# Patient Record
Sex: Female | Born: 1960 | Race: Black or African American | Hispanic: No | Marital: Single | Smoking: Never smoker
Health system: Southern US, Community
[De-identification: ages and names within clinical notes are randomized; demographics above are authoritative.]

## PROBLEM LIST (undated history)

## (undated) ENCOUNTER — Emergency Department (HOSPITAL_BASED_OUTPATIENT_CLINIC_OR_DEPARTMENT_OTHER): Payer: Self-pay | Source: Home / Self Care

## (undated) DIAGNOSIS — T7840XA Allergy, unspecified, initial encounter: Secondary | ICD-10-CM

## (undated) DIAGNOSIS — E119 Type 2 diabetes mellitus without complications: Secondary | ICD-10-CM

## (undated) DIAGNOSIS — M1990 Unspecified osteoarthritis, unspecified site: Secondary | ICD-10-CM

## (undated) DIAGNOSIS — D649 Anemia, unspecified: Secondary | ICD-10-CM

## (undated) HISTORY — DX: Anemia, unspecified: D64.9

## (undated) HISTORY — DX: Unspecified osteoarthritis, unspecified site: M19.90

## (undated) HISTORY — PX: KNEE SURGERY: SHX244

## (undated) HISTORY — DX: Allergy, unspecified, initial encounter: T78.40XA

---

## 2001-09-18 ENCOUNTER — Encounter: Admission: RE | Admit: 2001-09-18 | Discharge: 2001-09-18 | Payer: Self-pay | Admitting: Emergency Medicine

## 2001-09-18 ENCOUNTER — Encounter: Payer: Self-pay | Admitting: Emergency Medicine

## 2002-09-02 ENCOUNTER — Emergency Department (HOSPITAL_COMMUNITY): Admission: EM | Admit: 2002-09-02 | Discharge: 2002-09-02 | Payer: Self-pay | Admitting: Emergency Medicine

## 2006-12-18 ENCOUNTER — Encounter: Admission: RE | Admit: 2006-12-18 | Discharge: 2006-12-18 | Payer: Self-pay | Admitting: Family Medicine

## 2010-07-15 ENCOUNTER — Encounter: Payer: Self-pay | Admitting: Family Medicine

## 2014-05-03 ENCOUNTER — Encounter: Payer: Self-pay | Admitting: Family Medicine

## 2014-05-03 ENCOUNTER — Ambulatory Visit (INDEPENDENT_AMBULATORY_CARE_PROVIDER_SITE_OTHER): Payer: 59 | Admitting: Family Medicine

## 2014-05-03 ENCOUNTER — Telehealth: Payer: Self-pay | Admitting: Family Medicine

## 2014-05-03 VITALS — BP 140/92 | HR 99 | Temp 98.5°F | Resp 18 | Ht 67.5 in | Wt 263.6 lb

## 2014-05-03 DIAGNOSIS — J040 Acute laryngitis: Secondary | ICD-10-CM

## 2014-05-03 DIAGNOSIS — J029 Acute pharyngitis, unspecified: Secondary | ICD-10-CM

## 2014-05-03 DIAGNOSIS — E049 Nontoxic goiter, unspecified: Secondary | ICD-10-CM

## 2014-05-03 LAB — POCT CBC
Granulocyte percent: 66.8 %G (ref 37–80)
HCT, POC: 41.1 % (ref 37.7–47.9)
Hemoglobin: 12.3 g/dL (ref 12.2–16.2)
Lymph, poc: 2.8 (ref 0.6–3.4)
MCH, POC: 21.2 pg — AB (ref 27–31.2)
MCHC: 29.9 g/dL — AB (ref 31.8–35.4)
MCV: 70.7 fL — AB (ref 80–97)
MID (cbc): 0.6 (ref 0–0.9)
MPV: 8.2 fL (ref 0–99.8)
POC Granulocyte: 6.8 (ref 2–6.9)
POC LYMPH PERCENT: 27.6 %L (ref 10–50)
POC MID %: 5.6 %M (ref 0–12)
Platelet Count, POC: 329 10*3/uL (ref 142–424)
RBC: 5.82 M/uL — AB (ref 4.04–5.48)
RDW, POC: 14.2 %
WBC: 10.2 10*3/uL (ref 4.6–10.2)

## 2014-05-03 LAB — THYROID PANEL WITH TSH
Free Thyroxine Index: 2 (ref 1.4–3.8)
T3 Uptake: 28 % (ref 22–35)
T4, Total: 7 ug/dL (ref 4.5–12.0)
TSH: 1.305 u[IU]/mL (ref 0.350–4.500)

## 2014-05-03 LAB — POCT RAPID STREP A (OFFICE): Rapid Strep A Screen: NEGATIVE

## 2014-05-03 MED ORDER — FIRST-DUKES MOUTHWASH MT SUSP: 5.0000 mL | OROMUCOSAL | Status: DC | PRN

## 2014-05-03 MED ORDER — AMOXICILLIN 500 MG PO CAPS: 500.0000 mg | ORAL_CAPSULE | Freq: Two times a day (BID) | ORAL | Status: DC

## 2014-05-03 MED ORDER — HYDROCODONE-ACETAMINOPHEN 7.5-325 MG/15ML PO SOLN: 5.0000 mL | ORAL | Status: DC | PRN

## 2014-05-03 NOTE — Patient Instructions (Signed)
Laryngitis At the top of your windpipe is your voice box. It is the source of your voice. Inside your voice box are 2 bands of muscles called vocal cords. When you breathe, your vocal cords are relaxed and open so that air can get into the lungs. When you decide to say something, these cords come together and vibrate. The sound from these vibrations goes into your throat and comes out through your mouth as sound. Laryngitis is an inflammation of the vocal cords that causes hoarseness, cough, loss of voice, sore throat, and dry throat. Laryngitis can be temporary (acute) or long-term (chronic). Most cases of acute laryngitis improve with time.Chronic laryngitis lasts for more than 3 weeks. CAUSES Laryngitis can often be related to excessive smoking, talking, or yelling, as well as inhalation of toxic fumes and allergies. Acute laryngitis is usually caused by a viral infection, vocal strain, measles or mumps, or bacterial infections. Chronic laryngitis is usually caused by vocal cord strain, vocal cord injury, postnasal drip, growths on the vocal cords, or acid reflux. SYMPTOMS   Cough.  Sore throat.  Dry throat. RISK FACTORS  Respiratory infections.  Exposure to irritating substances, such as cigarette smoke, excessive amounts of alcohol, stomach acids, and workplace chemicals.  Voice trauma, such as vocal cord injury from shouting or speaking too loud. DIAGNOSIS  Your cargiver will perform a physical exam. During the physical exam, your caregiver will examine your throat. The most common sign of laryngitis is hoarseness. Laryngoscopy may be necessary to confirm the diagnosis of this condition. This procedure allows your caregiver to look into the larynx. HOME CARE INSTRUCTIONS  Drink enough fluids to keep your urine clear or pale yellow.  Rest until you no longer have symptoms or as directed by your caregiver.  Breathe in moist air.  Take all medicine as directed by your  caregiver.  Do not smoke.  Talk as little as possible (this includes whispering).  Write on paper instead of talking until your voice is back to normal.  Follow up with your caregiver if your condition has not improved after 10 days. SEEK MEDICAL CARE IF:   You have trouble breathing.  You cough up blood.  You have persistent fever.  You have increasing pain.  You have difficulty swallowing. MAKE SURE YOU:  Understand these instructions.  Will watch your condition.  Will get help right away if you are not doing well or get worse. Document Released: 06/10/2005 Document Revised: 09/02/2011 Document Reviewed: 08/16/2010 [facility] Patient Information 2015 [place], [place]. This information is not intended to replace advice given to you by your health care provider. Make sure you discuss any questions you have with your health care provider.  Pharyngitis Pharyngitis is redness, pain, and swelling (inflammation) of your pharynx.  CAUSES  Pharyngitis is usually caused by infection. Most of the time, these infections are from viruses (viral) and are part of a cold. However, sometimes pharyngitis is caused by bacteria (bacterial). Pharyngitis can also be caused by allergies. Viral pharyngitis may be spread from person to person by coughing, sneezing, and personal items or utensils (cups, forks, spoons, toothbrushes). Bacterial pharyngitis may be spread from person to person by more intimate contact, such as kissing.  SIGNS AND SYMPTOMS  Symptoms of pharyngitis include:   Sore throat.   Tiredness (fatigue).   Low-grade fever.   Headache.  Joint pain and muscle aches.  Skin rashes.  Swollen lymph nodes.  Plaque-like film on throat or tonsils (often seen with bacterial pharyngitis).  DIAGNOSIS  Your health care provider will ask you questions about your illness and your symptoms. Your medical history, along with a physical exam, is often all that is needed to diagnose  pharyngitis. Sometimes, a rapid strep test is done. Other lab tests may also be done, depending on the suspected cause.  TREATMENT  Viral pharyngitis will usually get better in 3-4 days without the use of medicine. Bacterial pharyngitis is treated with medicines that kill germs (antibiotics).  HOME CARE INSTRUCTIONS   Drink enough water and fluids to keep your urine clear or pale yellow.   Only take over-the-counter or prescription medicines as directed by your health care provider:   If you are prescribed antibiotics, make sure you finish them even if you start to feel better.   Do not take aspirin.   Get lots of rest.   Gargle with 8 oz of salt water ( tsp of salt per 1 qt of water) as often as every 1-2 hours to soothe your throat.   Throat lozenges (if you are not at risk for choking) or sprays may be used to soothe your throat. SEEK MEDICAL CARE IF:   You have large, tender lumps in your neck.  You have a rash.  You cough up green, yellow-brown, or bloody spit. SEEK IMMEDIATE MEDICAL CARE IF:   Your neck becomes stiff.  You drool or are unable to swallow liquids.  You vomit or are unable to keep medicines or liquids down.  You have severe pain that does not go away with the use of recommended medicines.  You have trouble breathing (not caused by a stuffy nose). MAKE SURE YOU:   Understand these instructions.  Will watch your condition.  Will get help right away if you are not doing well or get worse. Document Released: 06/10/2005 Document Revised: 03/31/2013 Document Reviewed: 02/15/2013 [facility] Patient Information 2015 [place], [place]. This information is not intended to replace advice given to you by your health care provider. Make sure you discuss any questions you have with your health care provider.

## 2014-05-03 NOTE — Telephone Encounter (Signed)
Forms completed and returned to fmla pile in front office. es

## 2014-05-03 NOTE — Progress Notes (Signed)
Subjective:    Patient ID: Diana Burns, female    DOB: 1960-12-06, 53 y.o.   MRN: [id number] This chart was scribed for Norberto SorensonEva Sakinah Rosamond, MD by Jolene Provostobert Halas, Medical Scribe. This patient was seen in Room 9 and the patient's care was started a 11:08 AM.  Chief Complaint  Patient presents with  . Sore Throat    x3 days    HPI Past Medical History  Diagnosis Date  . Allergy   . Anemia   . Arthritis    Allergies  Allergen Reactions  . Bee Venom   . Latex    No current outpatient prescriptions on file prior to visit.   No current facility-administered medications on file prior to visit.   HPI Comments: Diana Burns is a 53 y.o. [facility] of sore throat that started five days ago that got gradually better, and then worsened after she worked in the rain all day. Pt endorses associated throat pain and voice disturbance. Pt has used Alka-Seltzer, Halls cough drops and OTC cough syrup with some relief. Pt denies trouble breathing, SOB, or inability to swallow.    Review of Systems  Constitutional: Positive for chills, activity change, appetite change and fatigue. Negative for fever and diaphoresis.  HENT: Positive for congestion, sore throat, trouble swallowing and voice change. Negative for ear discharge, ear pain, mouth sores, nosebleeds, postnasal drip, rhinorrhea, sinus pressure and sneezing.   Eyes: Negative for pain and itching.  Respiratory: Negative for cough and shortness of breath.   Cardiovascular: Negative for chest pain.  Gastrointestinal: Negative for nausea, vomiting, abdominal pain, diarrhea and constipation.  Genitourinary: Negative for dysuria.  Musculoskeletal: Positive for myalgias and neck pain. Negative for joint swelling, arthralgias, gait problem and neck stiffness.  Neurological: Negative for dizziness, syncope and headaches.  Hematological: Positive for adenopathy.  Psychiatric/Behavioral: Positive for sleep disturbance.         Objective:  BP 140/92 mmHg  Pulse 99  Temp(Src) 98.5 F (36.9 C) (Oral)  Resp 18  Ht 5' 7.5" (1.715 m)  Wt 263 lb 9.6 oz (119.568 kg)  BMI 40.65 kg/m2  SpO2 97%  LMP 05/01/2014  Physical Exam  Constitutional: She is oriented to person, place, and time. She appears well-developed and well-nourished.  HENT:  Head: Normocephalic and atraumatic.  Right Ear: Tympanic membrane is retracted.  Left Ear: Tympanic membrane is retracted.  Nose: Nose normal.  Tonsils swollen to 3+ with right worse than left.  Eyes: Pupils are equal, round, and reactive to light.  Neck: Neck supple. Thyromegaly present.  Severe submandibular and supraclavicular adenopathy, enlarged thyroid.   Cardiovascular: Normal rate and regular rhythm.   Pulmonary/Chest: Effort normal and breath sounds normal. No respiratory distress.  Lymphadenopathy:    She has cervical adenopathy.  Neurological: She is alert and oriented to person, place, and time.  Skin: Skin is warm and dry.  Psychiatric: She has a normal mood and affect. Her behavior is normal.  Nursing note and vitals reviewed.      Assessment & Plan:   Acute pharyngitis, unspecified pharyngitis type - Plan: POCT rapid strep A, Culture, Group A Strep  Laryngitis, acute - pt recommended oow x 1-2d - reports her job mandates 3d oow at [employer] - fmla paperwork completed  Enlarged thyroid - Plan: POCT CBC, Thyroid Panel With TSH - recommend recheck in OV in 1-2 mos w/ US at that time if still feels enlarged - pt requests time for f/u OV be put  into her fmla papers.  Meds ordered this encounter  Medications  . HYDROcodone-acetaminophen (HYCET) 7.5-325 mg/15 ml solution    Sig: Take 5-10 mLs by mouth every 4 (four) hours as needed for moderate pain.    Dispense:  140 mL    Refill:  0  . Diphenhyd-Hydrocort-Nystatin (FIRST-DUKES MOUTHWASH) SUSP    Sig: Use as directed 5 mLs in the mouth or throat every 2 (two) hours as needed (sore throat).    Dispense:  237  mL    Refill:  0    Mix 2:1 ratio with viscous lidocaine  . amoxicillin (AMOXIL) 500 MG capsule    Sig: Take 1 capsule (500 mg total) by mouth 2 (two) times daily.    Dispense:  20 capsule    Refill:  0    I personally performed the services described in this documentation, which was scribed in my presence. The recorded information has been reviewed and considered, and addended by me as needed.  Norberto SorensonEva Kati Riggenbach, MD MPH   Results for orders placed or performed in visit on 05/03/14  Thyroid Panel With TSH  Result Value Ref Range   T4, Total 7.0 4.5 - 12.0 ug/dL   T3 Uptake 28 22 - 35 %   Free Thyroxine Index 2.0 1.4 - 3.8   TSH 1.305 0.350 - 4.500 uIU/mL  POCT rapid strep A  Result Value Ref Range   Rapid Strep A Screen Negative Negative  POCT CBC  Result Value Ref Range   WBC 10.2 4.6 - 10.2 K/uL   Lymph, poc 2.8 0.6 - 3.4   POC LYMPH PERCENT 27.6 10 - 50 %L   MID (cbc) 0.6 0 - 0.9   POC MID % 5.6 0 - 12 %M   POC Granulocyte 6.8 2 - 6.9   Granulocyte percent 66.8 37 - 80 %G   RBC 5.82 (A) 4.04 - 5.48 M/uL   Hemoglobin 12.3 12.2 - 16.2 g/dL   HCT, POC 16.141.1 09.637.7 - 47.9 %   MCV 70.7 (A) 80 - 97 fL   MCH, POC 21.2 (A) 27 - 31.2 pg   MCHC 29.9 (A) 31.8 - 35.4 g/dL   RDW, POC 04.514.2 %   Platelet Count, POC 329 142 - 424 K/uL   MPV 8.2 0 - 99.8 fL

## 2014-05-03 NOTE — Telephone Encounter (Signed)
Patient dropped of FMLA ppw after her OV on 05/03/2014. Patient was seen for an illness. Please return in 5-7 business days.

## 2014-05-04 ENCOUNTER — Encounter: Payer: Self-pay | Admitting: Family Medicine

## 2014-05-04 DIAGNOSIS — Z0271 Encounter for disability determination: Secondary | ICD-10-CM

## 2014-05-04 MED ORDER — PREDNISONE 20 MG PO TABS: 60.0000 mg | ORAL_TABLET | Freq: Every day | ORAL | Status: DC

## 2014-05-05 LAB — CULTURE, GROUP A STREP: Organism ID, Bacteria: NORMAL

## 2014-05-05 NOTE — Telephone Encounter (Signed)
Patient picked up forms.

## 2014-05-13 DIAGNOSIS — Z0271 Encounter for disability determination: Secondary | ICD-10-CM

## 2015-01-23 ENCOUNTER — Ambulatory Visit (INDEPENDENT_AMBULATORY_CARE_PROVIDER_SITE_OTHER): Payer: 59 | Admitting: Family Medicine

## 2015-01-23 VITALS — BP 130/84 | HR 77 | Temp 98.9°F | Resp 18 | Ht 67.75 in | Wt 268.0 lb

## 2015-01-23 DIAGNOSIS — R110 Nausea: Secondary | ICD-10-CM

## 2015-01-23 DIAGNOSIS — Z566 Other physical and mental strain related to work: Secondary | ICD-10-CM | POA: Diagnosis not present

## 2015-01-23 DIAGNOSIS — G43909 Migraine, unspecified, not intractable, without status migrainosus: Secondary | ICD-10-CM

## 2015-01-23 DIAGNOSIS — G4700 Insomnia, unspecified: Secondary | ICD-10-CM

## 2015-01-23 MED ORDER — ONDANSETRON 4 MG PO TBDP: 4.0000 mg | ORAL_TABLET | Freq: Once | ORAL | Status: DC

## 2015-01-23 MED ORDER — SUMATRIPTAN SUCCINATE 25 MG PO TABS
25.0000 mg | ORAL_TABLET | ORAL | Status: DC | PRN
Start: 2015-01-23 — End: 2016-01-16

## 2015-01-23 MED ORDER — KETOROLAC TROMETHAMINE 60 MG/2ML IM SOLN: 60.0000 mg | Freq: Once | INTRAMUSCULAR | Status: DC

## 2015-01-23 MED ORDER — ONDANSETRON 4 MG PO TBDP: 4.0000 mg | ORAL_TABLET | Freq: Three times a day (TID) | ORAL | Status: DC | PRN

## 2015-01-23 NOTE — Progress Notes (Signed)
Chief Complaint:  Chief Complaint  Patient presents with  . Migraine    Due to stress, usually worse in the spring/summer months  . Note    Needs a note for work     HPI: Diana Burns is a 54 y.o. female who reports to [facility] today complaining of migraine HA, she has had them before and usually able to work itself out and not be an issue.She has a diffuse throbbing HA, constant. She is tired but not confused. Light and noise sensitivity, no confusion, numbness or tingling. It has been intermittent, more recently due to job stress. She is very stressed at work, they are on forced over time, she is working 12 hours shifts she needs a FMLA 3 day leave so it does not count against her. The work burse told her to see a doctor for meds and also work note to be out of work. She feels this is on top of also being tired, she has had to work double shifts. Forced 12 hour shifts. Right now she feels better than she did in the past. Normally her migraines are  close her eys and feels better when she lays in the dark, but the last couple of years she has also GI upset with her migraines. She has had some nausea. She only drank some water today. She doe snto take anything for her headaches, usually toughs it out. She has taken some pills, has some light and noise sensitivity. She only gets about 2 hours of sleep. 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 Works at [employer]. 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     Denies CP , palpitations, SOB, diaphoresis.   Past Medical History  Diagnosis Date  . Allergy   . Anemia   . Arthritis    Past Surgical History  Procedure Laterality Date  . Knee surgery     History   Social History  . Marital Status: Single    Spouse Name: N/A  . Number of Children: N/A  . Years  of Education: N/A   Social History Main Topics  . Smoking status: Never Smoker   . Smokeless tobacco: Not on file  . Alcohol Use: Yes  . Drug Use: No  . Sexual Activity: Not on file   Other Topics Concern  . None   Social History Narrative   Raised between [place] and [place].   Family History  Problem Relation Age of Onset  . Cancer Mother   . Heart disease Mother   . Hyperlipidemia Mother   . Hypertension Mother    Allergies  Allergen Reactions  . Bee Venom   . Latex    Prior to Admission medications   Medication Sig Start Date End Date Taking? Authorizing Provider  amoxicillin (AMOXIL) 500 MG capsule Take 1 capsule (500 mg total) by mouth 2 (two) times daily. Patient not taking: Reported on 01/23/2015 05/03/14   Sherren Mocha, MD  Diphenhyd-Hydrocort-Nystatin (FIRST-DUKES MOUTHWASH) SUSP Use as directed 5 mLs in the mouth or throat every 2 (two) hours as needed (sore throat). Patient not taking: Reported on 01/23/2015 05/03/14   Sherren Mocha, MD  HYDROcodone-acetaminophen (HYCET) 7.5-325 mg/15 ml solution Take 5-10 mLs by mouth every 4 (  four) hours as needed for moderate pain. Patient not taking: Reported on 01/23/2015 05/03/14   Sherren Mocha, MD  predniSONE (DELTASONE) 20 MG tablet Take 3 tablets (60 mg total) by mouth daily with breakfast. x3d Patient not taking: Reported on 01/23/2015 05/04/14   Sherren Mocha, MD     ROS: The patient denies fevers, chills, night sweats, unintentional weight loss, chest pain, palpitations, wheezing, dyspnea on exertion,  vomiting, abdominal pain, dysuria, hematuria, melena, numbness, weakness, or tingling.  All other systems have been reviewed and were otherwise negative with the exception of those mentioned in the HPI and as above.    PHYSICAL EXAM: Filed Vitals:   01/23/15 1221  BP: 130/84  Pulse: 77  Temp: 98.9 F (37.2 C)  Resp: 18   Body mass index is 41.04 kg/(m^2).   General: Alert, no acute distress HEENT:  Normocephalic,  atraumatic, oropharynx patent. EOMI, PERRLA Cardiovascular:  Regular rate and rhythm, no rubs murmurs or gallops.  No Carotid bruits, radial pulse intact. No pedal edema.  Respiratory: Clear to auscultation bilaterally.  No wheezes, rales, or rhonchi.  No cyanosis, no use of accessory musculature Abdominal: No organomegaly, abdomen is soft and non-tender, positive bowel sounds. No masses. Skin: No rashes. Neurologic: Facial musculature symmetric. Psychiatric: Patient acts appropriately throughout our interaction. Lymphatic: No cervical or submandibular lymphadenopathy Musculoskeletal: Gait intact. No edema, tenderness   LABS: Results for orders placed or performed in visit on 05/03/14  Culture, Group A Strep  Result Value Ref Range   Organism ID, Bacteria Normal Upper Respiratory Flora    Organism ID, Bacteria No Beta Hemolytic Streptococci Isolated   Thyroid Panel With TSH  Result Value Ref Range   T4, Total 7.0 4.5 - 12.0 ug/dL   T3 Uptake 28 22 - 35 %   Free Thyroxine Index 2.0 1.4 - 3.8   TSH 1.305 0.350 - 4.500 uIU/mL  POCT rapid strep A  Result Value Ref Range   Rapid Strep A Screen Negative Negative  POCT CBC  Result Value Ref Range   WBC 10.2 4.6 - 10.2 K/uL   Lymph, poc 2.8 0.6 - 3.4   POC LYMPH PERCENT 27.6 10 - 50 %L   MID (cbc) 0.6 0 - 0.9   POC MID % 5.6 0 - 12 %M   POC Granulocyte 6.8 2 - 6.9   Granulocyte percent 66.8 37 - 80 %G   RBC 5.82 (A) 4.04 - 5.48 M/uL   Hemoglobin 12.3 12.2 - 16.2 g/dL   HCT, POC 16.1 09.6 - 47.9 %   MCV 70.7 (A) 80 - 97 fL   MCH, POC 21.2 (A) 27 - 31.2 pg   MCHC 29.9 (A) 31.8 - 35.4 g/dL   RDW, POC 04.5 %   Platelet Count, POC 329 142 - 424 K/uL   MPV 8.2 0 - 99.8 fL     EKG/XRAY:   Primary read interpreted by Dr. Conley Rolls at [facility].   ASSESSMENT/PLAN: Encounter Diagnoses  Name Primary?  . Migraine without status migrainosus, not intractable, unspecified migraine type Yes  . Nausea without vomiting   . Stress at work   .  Insomnia    Felt better after IVF She declined toradol although we already drew it She decline zofran in house, will give her rx I advise her to push fluids and also get more sleep She was given a rx for imitrex in case the otc excedrin does not work.  Work note written  Gross sideeffects, risk  and benefits, and alternatives of medications d/w patient. Patient is aware that all medications have potential sideeffects and we are unable to predict every sideeffect or drug-drug interaction that may occur.  Danialle Dement DO  01/24/2015 3:37 PM

## 2015-01-23 NOTE — Patient Instructions (Signed)
Sumatriptan injection What is this medicine? SUMATRIPTAN (soo ma TRIP tan) is used to treat migraines with or without aura. An aura is a strange feeling or visual disturbance that warns you of an attack. It is not used to prevent migraines. This medicine may be used for other purposes; ask your health care provider or pharmacist if you have questions. COMMON BRAND NAME(S): Alsuma, Imitrex, Imitrex Statdose, Sumavel DosePro System What should I tell my health care provider before I take this medicine? They need to know if you have any of these conditions: -bowel disease or colitis -diabetes -family history of heart disease -fast or irregular heart beat -heart or blood vessel disease, angina (chest pain), or previous heart attack -high blood pressure -high cholesterol -history of stroke, transient ischemic attacks (TIAs or mini-strokes), or intracranial bleeding -kidney or liver disease -overweight -poor circulation -postmenopausal or surgical removal of uterus and ovaries -Raynaud's disease -seizure disorder -an unusual or allergic reaction to sumatriptan, other medicines, foods, dyes, or preservatives -pregnant or trying to get pregnant -breast-feeding How should I use this medicine? This medicine is for injection under the skin. Follow the directions on the prescription label. Only use this medicine at the first symptoms of a migraine. It is not for everyday use. If you are using an autoinjector, read the instruction leaflet carefully. A single injection is given just under the skin. Before you make an injection, clean and examine your skin. Do not inject at a place where the skin is damaged or infected. If your symptoms return you can use a second injection. If there is no improvement at all in your symptoms after the first injection, call your doctor or health care professional. Wait at least 1 hour between doses and do not use more than 6 mg as a single dose. Do not use more than 12 mg  total in any 24 hour period. Do not use your medicine more often than directed. Talk to your pediatrician regarding the use of this medicine in children. Special care may be needed. Overdosage: If you think you have taken too much of this medicine contact a poison control center or emergency room at once. NOTE: This medicine is only for you. Do not share this medicine with others. What if I miss a dose? This does not apply; this medicine is not for regular use. What may interact with this medicine? Do not take this medicine with any of the following medicines: -amphetamine or cocaine -dihydroergotamine, ergotamine, ergoloid mesylates, methysergide, or ergot-type medication - do not take within 24 hours of taking sumatriptan. -feverfew -MAOIs like Carbex, Eldepryl, Marplan, Nardil, and Parnate - do not take sumatriptan within 2 weeks of stopping MAOI therapy. -other migraine medicines like almotriptan, eletriptan, naratriptan, rizatriptan, zolmitriptan - do not take within 24 hours of taking sumatriptan. -tryptophan This medicine may also interact with the following medications: -lithium -medicines for mental depression, anxiety or mood problems -medicines for weight loss such as dexfenfluramine, dextroamphetamine, fenfluramine, or sibutramine -St. John's wort This list may not describe all possible interactions. Give your health care provider a list of all the medicines, herbs, non-prescription drugs, or dietary supplements you use. Also tell them if you smoke, drink alcohol, or use illegal drugs. Some items may interact with your medicine. What should I watch for while using this medicine? Only use this medicine for a migraine headache. Use it if you get warning symptoms or at the start of a migraine attack. This medicine is not for regular use to prevent  migraine headaches. You may get drowsy or dizzy. Do not drive, use machinery, or do anything that needs mental alertness until you know how  this medicine affects you. To reduce dizzy or fainting spells, do not sit or stand up quickly, especially if you are an older patient. Alcohol can increase drowsiness, dizziness and flushing. Avoid alcoholic drinks. Smoking cigarettes may increase the risk of heart-related side effects from using this medicine. If you take migraine medicines for 10 or more days a month, your migraines may get worse. Keep a diary of headache days and medicine use. Contact your healthcare professional if your migraine attacks occur more frequently. What side effects may I notice from receiving this medicine? Side effects that you should report to your doctor or health care professional as soon as possible: -allergic reactions like skin rash, itching or hives, swelling of the face, lips, or tongue -fast, slow, or irregular heart beat -hallucinations -increased or decreased blood pressure -seizures -severe stomach pain and cramping, bloody diarrhea -signs and symptoms of a blood clot such as breathing problems; changes in vision; chest pain; severe, sudden headache; pain, swelling, warmth in the leg; trouble speaking; sudden numbness or weakness of the face, arm or leg -tingling, pain, or numbness in the face, hands or feet Side effects that usually do not require medical attention (report to your doctor or health care professional if they continue or are bothersome): -drowsiness -feeling warm, flushing, or redness of the face -headache -muscle cramps, pain -nausea, vomiting -pain, redness, or irritation at site where injected -unusually weak or tired This list may not describe all possible side effects. Call your doctor for medical advice about side effects. You may report side effects to FDA at 1-800-FDA-1088. Where should I keep my medicine? Keep out of the reach of children. Store at room temperature between 2 and 30 degrees C (36 and 86 degrees F). Protect from light. Throw away any unused medicine after the  expiration date. Make sure you receive a puncture-resistant container to dispose of the needles and syringes once you have finished with them. Do not reuse these items. Return the container to your health care professional for proper disposal. Keep the autoinjector device. NOTE: This sheet is a summary. It may not cover all possible information. If you have questions about this medicine, talk to your doctor, pharmacist, or health care provider.  2015, Elsevier/Gold Standard. (2013-03-05 17:31:23)   Headaches, Frequently Asked Questions MIGRAINE HEADACHES Q: What is migraine? What causes it? How can I treat it? A: Generally, migraine headaches begin as a dull ache. Then they develop into a constant, throbbing, and pulsating pain. You may experience pain at the temples. You may experience pain at the front or back of one or both sides of the head. The pain is usually accompanied by a combination of:  Nausea.  Vomiting.  Sensitivity to light and noise. Some people (about 15%) experience an aura (see below) before an attack. The cause of migraine is believed to be chemical reactions in the brain. Treatment for migraine may include over-the-counter or prescription medications. It may also include self-help techniques. These include relaxation training and biofeedback.  Q: What is an aura? A: About 15% of people with migraine get an "aura". This is a sign of neurological symptoms that occur before a migraine headache. You may see wavy or jagged lines, dots, or flashing lights. You might experience tunnel vision or blind spots in one or both eyes. The aura can include visual or auditory  hallucinations (something imagined). It may include disruptions in smell (such as strange odors), taste or touch. Other symptoms include:  Numbness.  A "pins and needles" sensation.  Difficulty in recalling or speaking the correct word. These neurological events may last as long as 60 minutes. These symptoms will  fade as the headache begins. Q: What is a trigger? A: Certain physical or environmental factors can lead to or "trigger" a migraine. These include:  Foods.  Hormonal changes.  Weather.  Stress. It is important to remember that triggers are different for everyone. To help prevent migraine attacks, you need to figure out which triggers affect you. Keep a headache diary. This is a good way to track triggers. The diary will help you talk to your healthcare professional about your condition. Q: Does weather affect migraines? A: Bright sunshine, hot, humid conditions, and drastic changes in barometric pressure may lead to, or "trigger," a migraine attack in some people. But studies have shown that weather does not act as a trigger for everyone with migraines. Q: What is the link between migraine and hormones? A: Hormones start and regulate many of your body's functions. Hormones keep your body in balance within a constantly changing environment. The levels of hormones in your body are unbalanced at times. Examples are during menstruation, pregnancy, or menopause. That can lead to a migraine attack. In fact, about three quarters of all women with migraine report that their attacks are related to the menstrual cycle.  Q: Is there an increased risk of stroke for migraine sufferers? A: The likelihood of a migraine attack causing a stroke is very remote. That is not to say that migraine sufferers cannot have a stroke associated with their migraines. In persons under age 51, the most common associated factor for stroke is migraine headache. But over the course of a person's normal life span, the occurrence of migraine headache may actually be associated with a reduced risk of dying from cerebrovascular disease due to stroke.  Q: What are acute medications for migraine? A: Acute medications are used to treat the pain of the headache after it has started. Examples over-the-counter medications, NSAIDs, ergots,  and triptans.  Q: What are the triptans? A: Triptans are the newest class of abortive medications. They are specifically targeted to treat migraine. Triptans are vasoconstrictors. They moderate some chemical reactions in the brain. The triptans work on receptors in your brain. Triptans help to restore the balance of a neurotransmitter called serotonin. Fluctuations in levels of serotonin are thought to be a main cause of migraine.  Q: Are over-the-counter medications for migraine effective? A: Over-the-counter, or "OTC," medications may be effective in relieving mild to moderate pain and associated symptoms of migraine. But you should see your caregiver before beginning any treatment regimen for migraine.  Q: What are preventive medications for migraine? A: Preventive medications for migraine are sometimes referred to as "prophylactic" treatments. They are used to reduce the frequency, severity, and length of migraine attacks. Examples of preventive medications include antiepileptic medications, antidepressants, beta-blockers, calcium channel blockers, and NSAIDs (nonsteroidal anti-inflammatory drugs). Q: Why are anticonvulsants used to treat migraine? A: During the past few years, there has been an increased interest in antiepileptic drugs for the prevention of migraine. They are sometimes referred to as "anticonvulsants". Both epilepsy and migraine may be caused by similar reactions in the brain.  Q: Why are antidepressants used to treat migraine? A: Antidepressants are typically used to treat people with depression. They may reduce migraine frequency  by regulating chemical levels, such as serotonin, in the brain.  Q: What alternative therapies are used to treat migraine? A: The term "alternative therapies" is often used to describe treatments considered outside the scope of conventional Western medicine. Examples of alternative therapy include acupuncture, acupressure, and yoga. Another common  alternative treatment is herbal therapy. Some herbs are believed to relieve headache pain. Always discuss alternative therapies with your caregiver before proceeding. Some herbal products contain arsenic and other toxins. TENSION HEADACHES Q: What is a tension-type headache? What causes it? How can I treat it? A: Tension-type headaches occur randomly. They are often the result of temporary stress, anxiety, fatigue, or anger. Symptoms include soreness in your temples, a tightening band-like sensation around your head (a "vice-like" ache). Symptoms can also include a pulling feeling, pressure sensations, and contracting head and neck muscles. The headache begins in your forehead, temples, or the back of your head and neck. Treatment for tension-type headache may include over-the-counter or prescription medications. Treatment may also include self-help techniques such as relaxation training and biofeedback. CLUSTER HEADACHES Q: What is a cluster headache? What causes it? How can I treat it? A: Cluster headache gets its name because the attacks come in groups. The pain arrives with little, if any, warning. It is usually on one side of the head. A tearing or bloodshot eye and a runny nose on the same side of the headache may also accompany the pain. Cluster headaches are believed to be caused by chemical reactions in the brain. They have been described as the most severe and intense of any headache type. Treatment for cluster headache includes prescription medication and oxygen. SINUS HEADACHES Q: What is a sinus headache? What causes it? How can I treat it? A: When a cavity in the bones of the face and skull (a sinus) becomes inflamed, the inflammation will cause localized pain. This condition is usually the result of an allergic reaction, a tumor, or an infection. If your headache is caused by a sinus blockage, such as an infection, you will probably have a fever. An x-ray will confirm a sinus blockage. Your  caregiver's treatment might include antibiotics for the infection, as well as antihistamines or decongestants.  REBOUND HEADACHES Q: What is a rebound headache? What causes it? How can I treat it? A: A pattern of taking acute headache medications too often can lead to a condition known as "rebound headache." A pattern of taking too much headache medication includes taking it more than 2 days per week or in excessive amounts. That means more than the label or a caregiver advises. With rebound headaches, your medications not only stop relieving pain, they actually begin to cause headaches. Doctors treat rebound headache by tapering the medication that is being overused. Sometimes your caregiver will gradually substitute a different type of treatment or medication. Stopping may be a challenge. Regularly overusing a medication increases the potential for serious side effects. Consult a caregiver if you regularly use headache medications more than 2 days per week or more than the label advises. ADDITIONAL QUESTIONS AND ANSWERS Q: What is biofeedback? A: Biofeedback is a self-help treatment. Biofeedback uses special equipment to monitor your body's involuntary physical responses. Biofeedback monitors:  Breathing.  Pulse.  Heart rate.  Temperature.  Muscle tension.  Brain activity. Biofeedback helps you refine and perfect your relaxation exercises. You learn to control the physical responses that are related to stress. Once the technique has been mastered, you do not need the equipment any  more. Q: Are headaches hereditary? A: Four out of five (80%) of people that suffer report a family history of migraine. Scientists are not sure if this is genetic or a family predisposition. Despite the uncertainty, a child has a 50% chance of having migraine if one parent suffers. The child has a 75% chance if both parents suffer.  Q: Can children get headaches? A: By the time they reach high school, most young  people have experienced some type of headache. Many safe and effective approaches or medications can prevent a headache from occurring or stop it after it has begun.  Q: What type of doctor should I see to diagnose and treat my headache? A: Start with your primary caregiver. Discuss his or her experience and approach to headaches. Discuss methods of classification, diagnosis, and treatment. Your caregiver may decide to recommend you to a headache specialist, depending upon your symptoms or other physical conditions. Having diabetes, allergies, etc., may require a more comprehensive and inclusive approach to your headache. The National Headache Foundation will provide, upon request, a list of [facility] physician members in your state. Document Released: 08/31/2003 Document Revised: 09/02/2011 Document Reviewed: 02/08/2008 [facility] Patient Information 2015 [place], [place]. This information is not intended to replace advice given to you by your health care provider. Make sure you discuss any questions you have with your health care provider.

## 2015-01-25 ENCOUNTER — Telehealth: Payer: Self-pay

## 2015-01-25 NOTE — Telephone Encounter (Signed)
Pt states it is really important regarding her FMLA papers, states she is to go back to work at midnight but need the papers, will come by and pick up. Please call [phone]

## 2015-01-25 NOTE — Telephone Encounter (Signed)
Patient dropped off paperwork for FMLA on 01/23/15 to be completed by Dr. Conley Rolls. I have highlighted the areas that need to be completed and returned to disabilities box with the doctors signature. Patient called in on 01/25/15 stating she needed the paper work by [employer], Dr Conley Rolls. Is not scheduled to come in until 4pm so we will do the best we can to get this completed and back to her but we normally like to ask for 5-7 business days.

## 2015-01-25 NOTE — Telephone Encounter (Signed)
For your info. 

## 2015-01-26 NOTE — Telephone Encounter (Signed)
Completed ppw scanned into computer and faxed over to contact number provided.

## 2015-02-07 DIAGNOSIS — Z0271 Encounter for disability determination: Secondary | ICD-10-CM

## 2015-06-28 ENCOUNTER — Ambulatory Visit (INDEPENDENT_AMBULATORY_CARE_PROVIDER_SITE_OTHER): Payer: 59 | Admitting: Family Medicine

## 2015-06-28 VITALS — BP 126/82 | HR 89 | Temp 98.2°F | Resp 14 | Ht 68.0 in | Wt 259.4 lb

## 2015-06-28 DIAGNOSIS — J069 Acute upper respiratory infection, unspecified: Secondary | ICD-10-CM | POA: Diagnosis not present

## 2015-06-28 DIAGNOSIS — R05 Cough: Secondary | ICD-10-CM | POA: Diagnosis not present

## 2015-06-28 DIAGNOSIS — J040 Acute laryngitis: Secondary | ICD-10-CM

## 2015-06-28 DIAGNOSIS — R0781 Pleurodynia: Secondary | ICD-10-CM

## 2015-06-28 DIAGNOSIS — J0100 Acute maxillary sinusitis, unspecified: Secondary | ICD-10-CM | POA: Diagnosis not present

## 2015-06-28 DIAGNOSIS — R059 Cough, unspecified: Secondary | ICD-10-CM

## 2015-06-28 DIAGNOSIS — R6889 Other general symptoms and signs: Secondary | ICD-10-CM | POA: Diagnosis not present

## 2015-06-28 LAB — POCT INFLUENZA A/B
Influenza A, POC: NEGATIVE
Influenza B, POC: NEGATIVE

## 2015-06-28 LAB — POCT RAPID STREP A (OFFICE): Rapid Strep A Screen: NEGATIVE

## 2015-06-28 MED ORDER — AMOXICILLIN-POT CLAVULANATE 875-125 MG PO TABS: 1.0000 | ORAL_TABLET | Freq: Two times a day (BID) | ORAL | Status: DC

## 2015-06-28 MED ORDER — BENZONATATE 100 MG PO CAPS: 200.0000 mg | ORAL_CAPSULE | Freq: Two times a day (BID) | ORAL | Status: DC | PRN

## 2015-06-28 MED ORDER — HYDROCODONE-HOMATROPINE 5-1.5 MG/5ML PO SYRP: 5.0000 mL | ORAL_SOLUTION | Freq: Every evening | ORAL | Status: DC | PRN

## 2015-06-28 NOTE — Progress Notes (Signed)
Chief Complaint:  Chief Complaint  Patient presents with  . Laryngitis  . Sore Throat  . Cough    non productive  . Chest Pain  . Nasal Congestion  . Abdominal Pain  . Immunizations    flu shot if ok    HPI: Diana Burns is a 55 y.o. female who reports to [facility] today complaining of URI sxs including  subjective fever, she took alkaselzter with good releif,  Nasal congestion, laryngitis, she has sore thraot, runny nose, she has been coughin but nothing productive, seh has had pleuritic CPwith cough only, not as rest. NO SOB or CP or palpitations with exertion.   Work colleagues have been sick. Denies N/v/diarrhea. Stomach ahs been quesyu. She felt a lot better after alkaselzter and lost her voice again. Some nause and abd pain with cough.   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 There has been a moment of clarity inher life in the last month. Recelty found out that her 6 y/o college friend died in his sleep, his wake and funeral are tomorrow and Friday. She wants to be well enough to go tho these things and also rest so she does not get anythign worse like PNA.     LAst OV 02/07/2015 [name] is a 55 y.o. female who reports to [facility] today complaining of migraine HA, she has had them before and usually able to work itself out and not be an issue.She has a diffuse throbbing HA, constant. She is tired but not confused. Light and noise sensitivity, no confusion, numbness or tingling. It has been intermittent, more recently due to job stress. She is very stressed at work, they are on forced over time, she is working 12 hours shifts she needs a FMLA 3 day leave so it does not count against her. The work burse told her to see a doctor for meds and also work note to be out of work. She feels this is on top of also being tired, she has had to work double shifts. Forced 12 hour shifts. Right now she feels better  than she did in the past. Normally her migraines are close her eys and feels better when she lays in the dark, but the last couple of years she has also GI upset with her migraines. She has had some nausea. She only drank some water today. She doe snto take anything for her headaches, usually toughs it out. She has taken some pills, has some light and noise sensitivity. She only gets about 2 hours of sleep. 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 Works at [employer]. 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   Denies CP , palpitations, SOB, diaphoresis.   Past Medical History  Diagnosis Date  . Allergy   . Anemia   . Arthritis    Past Surgical History  Procedure Laterality Date  . Knee surgery     Social History   Social History  . Marital Status: Single    Spouse Name: N/A  . Number of Children: N/A  . Years of Education: N/A   Social History Main Topics  . Smoking  status: Never Smoker   . Smokeless tobacco: None  . Alcohol Use: Yes  . Drug Use: No  . Sexual Activity: Not Asked   Other Topics Concern  . None   Social History Narrative   [place]   Family History  Problem Relation Age of Onset  . Cancer Mother   . Heart disease Mother   . Hyperlipidemia Mother   . Hypertension Mother    Allergies  Allergen Reactions  . Bee Venom   . Latex    Prior to Admission medications   Medication Sig Start Date End Date Taking? Authorizing Provider  SUMAtriptan (IMITREX) 25 MG tablet Take 1 tablet (25 mg total) by mouth every 2 (two) hours as needed for migraine. May repeat in 2 hours if headache persists or recurs. No more than 200 mg in 24 hour period 01/23/15  Yes Kelcey Wickstrom P Less Woolsey, DO  ondansetron (ZOFRAN ODT) 4 MG disintegrating tablet Take 1 tablet (4 mg total) by  mouth every 8 (eight) hours as needed for nausea or vomiting. Patient not taking: Reported on 06/28/2015 01/23/15   Breona Cherubin P Eris Breck, DO     ROS: The patient denies  night sweats, unintentional weight loss, palpitations, wheezing, dyspnea on exertion,  vomiting,  dysuria, hematuria, melena, numbness, weakness, or tingling. NO diarrhea or rashes  All other systems have been reviewed and were otherwise negative with the exception of those mentioned in the HPI and as above.    PHYSICAL EXAM: Filed Vitals:   06/28/15 1640  BP: 126/82  Pulse: 89  Temp: 98.2 F (36.8 C)  Resp: 14   Body mass index is 39.45 kg/(m^2).   General: Alert, no acute distress HEENT:  Normocephalic, atraumatic, oropharynx patent. EOMI, PERRLA Erythematous throat, no exudates, TM normal, + sinus tenderness, + erythematous/boggy nasal mucosa Cardiovascular:  Regular rate and rhythm, no rubs murmurs or gallops.  No Carotid bruits, radial pulse intact. No pedal edema.  Respiratory: Clear to auscultation bilaterally.  No wheezes, rales, or rhonchi.  No cyanosis, no use of accessory musculature Abdominal: No organomegaly, abdomen is soft and non-tender, positive bowel sounds. No masses. Skin: No rashes. Neurologic: Facial musculature symmetric. Psychiatric: Patient acts appropriately throughout our interaction. Lymphatic: No cervical or submandibular lymphadenopathy Musculoskeletal: Gait intact. No edema, tenderness   LABS: Results for orders placed or performed in visit on 06/28/15  POCT rapid strep A  Result Value Ref Range   Rapid Strep A Screen Negative Negative  POCT Influenza A/B  Result Value Ref Range   Influenza A, POC Negative Negative   Influenza B, POC Negative Negative     EKG/XRAY:   Primary read [facility]   ASSESSMENT/PLAN: Encounter Diagnoses  Name Primary?  . Pleuritic chest pain   . Flu-like symptoms   . Cough   . Laryngitis, acute   . Acute upper respiratory infection  Yes  . Acute maxillary sinusitis, recurrence not specified    Rx augmenti, tessallon perles prn, and also hycodan prn Advise to return to get CHest xray prn, she declines it today since only with cough FU prn , work note given, nasocort otc  Gross sideeffects, risk and benefits, and alternatives of medications d/w patient. Patient is aware that all medications have potential sideeffects and we are unable to predict every sideeffect or drug-drug interaction that may occur.  Rosellen Lichtenberger DO  06/29/2015 11:52 AM

## 2015-06-30 ENCOUNTER — Telehealth: Payer: Self-pay

## 2015-06-30 DIAGNOSIS — Z0271 Encounter for disability determination: Secondary | ICD-10-CM

## 2015-06-30 LAB — CULTURE, GROUP A STREP: Organism ID, Bacteria: NORMAL

## 2015-06-30 NOTE — Telephone Encounter (Signed)
Patient brought in FMLA forms into be completed by Dr Conley RollsLe. I have filled out what I can from the OV notes and highlight whats needs to be completed. Please fill out and return to the FMLA/Disability box at the [employer] checkout desk within 5-7 business days. Placing them in your box on 06/30/15.

## 2015-07-03 NOTE — Telephone Encounter (Signed)
Per answering service;  Pt needs "a return to work letter changed so that he won't be fired.. Called @ [phone]/no answer// lft vm// Asked that Pt return call detailing what changes are need to the existing return to work letter.

## 2015-07-04 NOTE — Telephone Encounter (Signed)
FMLA forms have been completed and scanned. Patient notified and she would like to pick them up. They are in the pick up drawer.

## 2015-09-25 ENCOUNTER — Ambulatory Visit (INDEPENDENT_AMBULATORY_CARE_PROVIDER_SITE_OTHER): Payer: 59 | Admitting: Internal Medicine

## 2015-09-25 VITALS — BP 138/84 | HR 86 | Temp 98.4°F | Resp 17 | Ht 68.5 in | Wt 258.0 lb

## 2015-09-25 DIAGNOSIS — G8929 Other chronic pain: Secondary | ICD-10-CM | POA: Diagnosis not present

## 2015-09-25 DIAGNOSIS — M25562 Pain in left knee: Secondary | ICD-10-CM

## 2015-09-25 NOTE — Progress Notes (Signed)
   Subjective:    Patient ID: Diana Burns, female    DOB: 26-Mar-1961, 55 y.o.   MRN: [id number] By signing my name below, I, Littie Deedsichard Sun, attest that this documentation has been prepared under the direction and in the presence of Ellamae Siaobert Shakiah Wester, MD.  Electronically Signed: Littie Deedsichard Sun, Medical Scribe. 09/25/2015. 4:20 PM.  HPI HPI Comments: Diana Burns is a 55 y.o. female who presents to the [facility] complaining of left knee pain. Patient states she tore her left knee in 2014 and had an arthroscopic surgery by Dr. Thomasena Edisollins. She has an orthopedist appointment in 3 days, but she notes they are trying to work her in tomorrow. She works as a [employer] and would like to be written out of work until she can see the orthopedist.   Review of Systems     Objective:   Physical Exam  Constitutional: She is oriented to person, place, and time. She appears well-developed and well-nourished. No distress.  HENT:  Head: Normocephalic and atraumatic.  Mouth/Throat: Oropharynx is clear and moist. No oropharyngeal exudate.  Eyes: Pupils are equal, round, and reactive to light.  Neck: Neck supple.  Cardiovascular: Normal rate.   Pulmonary/Chest: Effort normal.  Musculoskeletal: She exhibits no edema.  Left knee mildly swollen with slight effusion, but no redness. ROM limited by pain. Tender along both joint lines, but without obvious laxity. Scars are present from prior arthroscopy.  Neurological: She is alert and oriented to person, place, and time. No cranial nerve deficit.  Skin: Skin is warm and dry. No rash noted.  Psychiatric: She has a normal mood and affect. Her behavior is normal.  Nursing note and vitals reviewed. BP 138/84 mmHg  Pulse 86  Temp(Src) 98.4 F (36.9 C) (Oral)  Resp 17  Ht 5' 8.5" (1.74 m)  Wt 258 lb (117.028 kg)  BMI 38.65 kg/m2  SpO2 97%  LMP 08/25/2015     Assessment & Plan:  Knee pain, chronic, left  Take oow to allow nonweighbearing til  can see ortho Dr Thomasena Edisollins on Thursday  I have completed the patient encounter in its entirety as documented by the scribe, with editing by me where necessary. Maida Widger P. Merla Richesoolittle, M.D.

## 2015-09-25 NOTE — Patient Instructions (Signed)
     IF you received an x-ray today, you will receive an invoice from [facility]. Please contact [facility] at [phone] with questions or concerns regarding your invoice.   IF you received labwork today, you will receive an invoice from [facility]. Please contact [facility] at [phone] with questions or concerns regarding your invoice.   Our billing staff will not be able to assist you with questions regarding bills from these companies.  You will be contacted with the lab results as soon as they are available. The fastest way to get your results is to activate your My Chart account. Instructions are located on the last page of this paperwork. If you have not heard from us regarding the results in 2 weeks, please contact this office.      

## 2015-10-24 ENCOUNTER — Telehealth: Payer: Self-pay

## 2015-10-24 NOTE — Telephone Encounter (Signed)
Patient is asking is we can call her once we received a fax from [facility] for surgical clearance. She wants to be sure that we receive it.  Please look out for fax.  [phone]

## 2015-10-29 ENCOUNTER — Encounter: Payer: Self-pay | Admitting: Internal Medicine

## 2015-11-06 ENCOUNTER — Other Ambulatory Visit: Payer: Self-pay | Admitting: Orthopedic Surgery

## 2015-11-18 ENCOUNTER — Emergency Department (HOSPITAL_COMMUNITY)
Admission: EM | Admit: 2015-11-18 | Discharge: 2015-11-18 | Disposition: A | Discharge status: Home / Self Care | Payer: 59 | Attending: Emergency Medicine | Admitting: Emergency Medicine

## 2015-11-18 ENCOUNTER — Emergency Department (HOSPITAL_COMMUNITY): Payer: 59

## 2015-11-18 ENCOUNTER — Encounter (HOSPITAL_COMMUNITY): Payer: Self-pay | Admitting: *Deleted

## 2015-11-18 DIAGNOSIS — W298XXA Contact with other powered powered hand tools and household machinery, initial encounter: Secondary | ICD-10-CM

## 2015-11-18 DIAGNOSIS — Y9389 Activity, other specified: Secondary | ICD-10-CM | POA: Insufficient documentation

## 2015-11-18 DIAGNOSIS — S6982XA Other specified injuries of left wrist, hand and finger(s), initial encounter: Secondary | ICD-10-CM

## 2015-11-18 DIAGNOSIS — Y92513 Shop (commercial) as the place of occurrence of the external cause: Secondary | ICD-10-CM | POA: Diagnosis not present

## 2015-11-18 DIAGNOSIS — W499XXA Exposure to other inanimate mechanical forces, initial encounter: Secondary | ICD-10-CM | POA: Diagnosis not present

## 2015-11-18 DIAGNOSIS — M7989 Other specified soft tissue disorders: Secondary | ICD-10-CM | POA: Diagnosis present

## 2015-11-18 DIAGNOSIS — Z23 Encounter for immunization: Secondary | ICD-10-CM | POA: Diagnosis not present

## 2015-11-18 DIAGNOSIS — S6992XA Unspecified injury of left wrist, hand and finger(s), initial encounter: Secondary | ICD-10-CM | POA: Insufficient documentation

## 2015-11-18 DIAGNOSIS — R739 Hyperglycemia, unspecified: Secondary | ICD-10-CM | POA: Insufficient documentation

## 2015-11-18 DIAGNOSIS — Z91040 Latex allergy status: Secondary | ICD-10-CM | POA: Diagnosis not present

## 2015-11-18 DIAGNOSIS — Z79899 Other long term (current) drug therapy: Secondary | ICD-10-CM | POA: Insufficient documentation

## 2015-11-18 DIAGNOSIS — Y990 Civilian activity done for income or pay: Secondary | ICD-10-CM | POA: Diagnosis not present

## 2015-11-18 DIAGNOSIS — R7309 Other abnormal glucose: Secondary | ICD-10-CM

## 2015-11-18 LAB — CBC WITH DIFFERENTIAL/PLATELET
BASOS ABS: 0 10*3/uL (ref 0.0–0.1)
Basophils Relative: 0 %
EOS ABS: 0.1 10*3/uL (ref 0.0–0.7)
Eosinophils Relative: 2 %
HCT: 40.6 % (ref 36.0–46.0)
HEMOGLOBIN: 12.5 g/dL (ref 12.0–15.0)
LYMPHS ABS: 3.2 10*3/uL (ref 0.7–4.0)
Lymphocytes Relative: 53 %
MCH: 21.1 pg — AB (ref 26.0–34.0)
MCHC: 30.8 g/dL (ref 30.0–36.0)
MCV: 68.5 fL — ABNORMAL LOW (ref 78.0–100.0)
Monocytes Absolute: 0.4 10*3/uL (ref 0.1–1.0)
Monocytes Relative: 7 %
NEUTROS PCT: 37 %
Neutro Abs: 2.3 10*3/uL (ref 1.7–7.7)
Platelets: 242 10*3/uL (ref 150–400)
RBC: 5.93 MIL/uL — AB (ref 3.87–5.11)
RDW: 14.4 % (ref 11.5–15.5)
WBC: 6 10*3/uL (ref 4.0–10.5)

## 2015-11-18 LAB — BASIC METABOLIC PANEL
ANION GAP: 9 (ref 5–15)
BUN: 9 mg/dL (ref 6–20)
CHLORIDE: 103 mmol/L (ref 101–111)
CO2: 23 mmol/L (ref 22–32)
Calcium: 9.1 mg/dL (ref 8.9–10.3)
Creatinine, Ser: 0.62 mg/dL (ref 0.44–1.00)
GFR calc non Af Amer: >60 mL/min (ref >60)
Glucose, Bld: 329 mg/dL — ABNORMAL HIGH (ref 65–99)
POTASSIUM: 3.6 mmol/L (ref 3.5–5.1)
SODIUM: 135 mmol/L (ref 135–145)

## 2015-11-18 MED ORDER — HYDROCODONE-ACETAMINOPHEN 5-325 MG PO TABS
2.0000 | ORAL_TABLET | Freq: Once | ORAL | Status: AC
  Administered 2015-11-18: 2 via ORAL
  Filled 2015-11-18: qty 2

## 2015-11-18 MED ORDER — TETANUS-DIPHTH-ACELL PERTUSSIS 5-2.5-18.5 LF-MCG/0.5 IM SUSP
0.5000 mL | Freq: Once | INTRAMUSCULAR | Status: AC
  Administered 2015-11-18: 0.5 mL via INTRAMUSCULAR
  Filled 2015-11-18: qty 0.5

## 2015-11-18 MED ORDER — CEPHALEXIN 500 MG PO CAPS: 500.0000 mg | ORAL_CAPSULE | Freq: Four times a day (QID) | ORAL | Status: DC

## 2015-11-18 MED ORDER — HYDROMORPHONE HCL 1 MG/ML IJ SOLN
0.5000 mg | Freq: Once | INTRAMUSCULAR | Status: AC
Start: 2015-11-18 — End: 2015-11-18
  Administered 2015-11-18: 0.5 mg via INTRAVENOUS
  Filled 2015-11-18: qty 1

## 2015-11-18 MED ORDER — CEFAZOLIN SODIUM 1-5 GM-% IV SOLN
1.0000 g | Freq: Once | INTRAVENOUS | Status: AC
  Administered 2015-11-18: 1 g via INTRAVENOUS
  Filled 2015-11-18: qty 50

## 2015-11-18 MED ORDER — HYDROCODONE-ACETAMINOPHEN 5-325 MG PO TABS: ORAL_TABLET | ORAL | Status: DC

## 2015-11-18 MED ORDER — DOXYCYCLINE HYCLATE 100 MG PO CAPS: 100.0000 mg | ORAL_CAPSULE | Freq: Two times a day (BID) | ORAL | Status: DC

## 2015-11-18 MED ORDER — SODIUM CHLORIDE 0.9 % IV BOLUS (SEPSIS)
1000.0000 mL | Freq: Once | INTRAVENOUS | Status: AC
  Administered 2015-11-18: 1000 mL via INTRAVENOUS

## 2015-11-18 NOTE — Consult Note (Signed)
Diana Burns is an 55 y.o. female.   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  This occurred ~1230 PM.  She sustained a wound in the finger over the proximal phalanx and had associated swelling and pain of the digit.  The pain was severe.  Seen at [facility] where XR were taken and I was consulted for evaluation of the injury.  She reports no previous injury to the finger and no other injury at this time.  Her pain has improved since her arrival in the ED.  Case discussed with Monico Blitz, PA-C and her note from 11/18/15 reviewed. Xrays viewed and interpreted by me: 3 views left hand show no fractures, dislocations, radioopaque foreign bodies.  Definition of soft tissues into hand likely related to water/air. Labs reviewed: WBC 6.0, glucose 329  Allergies:  Allergies  Allergen Reactions  . Aspartame And Phenylalanine Other (See Comments)    Artificial sweetners - headache  . Bee Venom Swelling  . Latex Hives and Itching    Past Medical History  Diagnosis Date  . Allergy   . Anemia   . Arthritis     Past Surgical History  Procedure Laterality Date  . Knee surgery      Family History: Family History  Problem Relation Age of Onset  . Cancer Mother   . Heart disease Mother   . Hyperlipidemia Mother   . Hypertension Mother     Social History:   reports that she has never smoked. She does not have any smokeless tobacco history on file. She reports that she drinks alcohol. She reports that she does not use illicit drugs.  Medications:  (Not in a hospital admission)  Results for orders placed or performed during the hospital encounter of 11/18/15 (from the past 48 hour(s))  CBC with Differential     Status: Abnormal   Collection Time: 11/18/15  1:30 PM  Result Value Ref Range   WBC 6.0 4.0 -  10.5 K/uL   RBC 5.93 (H) 3.87 - 5.11 MIL/uL   Hemoglobin 12.5 12.0 - 15.0 g/dL   HCT 40.6 36.0 - 46.0 %   MCV 68.5 (L) 78.0 - 100.0 fL   MCH 21.1 (L) 26.0 - 34.0 pg   MCHC 30.8 30.0 - 36.0 g/dL   RDW 14.4 11.5 - 15.5 %   Platelets 242 150 - 400 K/uL   Neutrophils Relative % 37 %   Neutro Abs 2.3 1.7 - 7.7 K/uL   Lymphocytes Relative 53 %   Lymphs Abs 3.2 0.7 - 4.0 K/uL   Monocytes Relative 7 %   Monocytes Absolute 0.4 0.1 - 1.0 K/uL   Eosinophils Relative 2 %   Eosinophils Absolute 0.1 0.0 - 0.7 K/uL   Basophils Relative 0 %   Basophils Absolute 0.0 0.0 - 0.1 K/uL  Basic metabolic panel     Status: Abnormal   Collection Time: 11/18/15  1:30 PM  Result Value Ref Range   Sodium 135 135 - 145 mmol/L   Potassium 3.6 3.5 - 5.1 mmol/L   Chloride 103 101 - 111 mmol/L   CO2 23 22 - 32 mmol/L   Glucose, Bld 329 (H) 65 - 99 mg/dL   BUN 9 6 - 20 mg/dL   Creatinine, Ser 0.62 0.44 - 1.00 mg/dL   Calcium 9.1 8.9 - 10.3 mg/dL  GFR calc non Af Amer >60 >60 mL/min   GFR calc Af Amer >60 >60 mL/min    Comment: (NOTE) The eGFR has been calculated using the CKD EPI equation. This calculation has not been validated in all clinical situations. eGFR's persistently <60 mL/min signify possible Chronic Kidney Disease.    Anion gap 9 5 - 15    Dg Chest Port 1 View  11/18/2015  CLINICAL DATA:  Preop. EXAM: PORTABLE CHEST 1 VIEW COMPARISON:  None. FINDINGS: The heart size and mediastinal contours are within normal limits. Both lungs are clear. The visualized skeletal structures are unremarkable. IMPRESSION: No acute cardiopulmonary abnormality seen. Electronically Signed   By: Marijo Conception, M.D.   On: 11/18/2015 13:53   Dg Hand Complete Left  11/18/2015  CLINICAL DATA:  States she was using a pressure washer and the nozzle came back on her left hand. Pt has laceration to medial aspect/inside of 2nd digit, at the base of the finger. EXAM: LEFT HAND - COMPLETE 3+ VIEW COMPARISON:  None. FINDINGS:  There is no evidence of fracture or dislocation. There is no evidence of arthropathy or other focal bone abnormality. Soft tissue emphysema at the base of the second phalanx extending into the volar and dorsal aspect of the hand. IMPRESSION: 1.  No acute osseous injury of the left and. 2. Soft tissue emphysema at the base of the second phalanx extending into the volar and dorsal aspect of the hand. Electronically Signed   By: Kathreen Devoid   On: 11/18/2015 13:54     A comprehensive review of systems was negative. Review of Systems: No fevers, chills, night sweats, chest pain, shortness of breath, nausea, vomiting, diarrhea, constipation, easy bleeding or bruising, headaches, dizziness, vision changes, fainting.   Blood pressure 147/82, pulse 86, temperature 97.8 F (36.6 C), temperature source Oral, resp. rate 18, last menstrual period 10/31/2015, SpO2 97 %.  General appearance: alert, cooperative and appears stated age Head: Normocephalic, without obvious abnormality, atraumatic Neck: supple, symmetrical, trachea midline Extremities: Intact sensation and capillary refill all digits.  +epl/fpl/io.  Left index with wound at volar proximal phalanx.  +fdp/fds though limited by pain and swelling.  Finger mildly swollen, not tense.  Skin creases appear normal.  Brisk capillary refill and intact sensation in fingertip.  Swelling on dorsum of hand with crepitance.  ttp over injection site, but significantly less so elsewhere.no tenderness in wrist/forearm Pulses: 2+ and symmetric Skin: Skin color, texture, turgor normal. No rashes or lesions Neurologic: Grossly normal Incision/Wound: As above  Assessment/Plan Left index finger injection injury with water without soap or solvents.  Discussed nature of injury with patient and friend who is with her.  I think this will resolve without operative intervention.  No signs of compartment syndrome or vascular compromise at this time.  Will plan reexamination in  ~ 1 hour and if no worsening of symptoms will plan d/c home with pain medication, antibiotics, and follow up this week.  Discussed signs to watch for including worsening pain, swelling, loss of capillary refill in digit.  She voiced understanding and agrees with the plan of care.  Abimelec Grochowski R 11/18/2015, 4:53 PM

## 2015-11-18 NOTE — ED Notes (Signed)
Pt verbalized understanding of d/c instructions and has no further questions. Pt stable and NAD. Pt to follow up with Dr Merlyn LotKuzma Tuesday.

## 2015-11-18 NOTE — ED Provider Notes (Signed)
CSN: [id number]     Arrival date & time 11/18/15  1249 History   First MD Initiated Contact with Patient 11/18/15 1310     No chief complaint on file.    (Consider location/radiation/quality/duration/timing/severity/associated sxs/prior Treatment) HPI   Blood pressure 158/97, pulse 88, temperature 97.8 F (36.6 C), temperature source Oral, resp. rate 20, SpO2 93 %.  Diana Burns is a 55 y.o. female with no significant past medical history however, does not follow regularly with primary care complaining of severe pain and swelling to left second digit status post pressure washer injection onset at 12:30 PM. She works at a car wash. She activated the pressure washer while the nozzle was in her hand. She is right-hand dominant, states that the pain is severe, exacerbated by movement and palpation. She does not know when her last tetanus shot was. She has not eaten today, cannot remember when she last had any thing to drink today.  Past Medical History  Diagnosis Date  . Allergy   . Anemia   . Arthritis    Past Surgical History  Procedure Laterality Date  . Knee surgery     Family History  Problem Relation Age of Onset  . Cancer Mother   . Heart disease Mother   . Hyperlipidemia Mother   . Hypertension Mother    Social History  Substance Use Topics  . Smoking status: Never Smoker   . Smokeless tobacco: None  . Alcohol Use: Yes   OB History    No data available     Review of Systems  10 systems reviewed and found to be negative, except as noted in the HPI.   Allergies  Aspartame and phenylalanine; Bee venom; and Latex  Home Medications   Prior to Admission medications   Medication Sig Start Date End Date Taking? Authorizing Provider  SUMAtriptan (IMITREX) 25 MG tablet Take 1 tablet (25 mg total) by mouth every 2 (two) hours as needed for migraine. May repeat in 2 hours if headache persists or recurs. No more than 200 mg in 24 hour period Patient not taking: Reported  on 11/18/2015 01/23/15   Thao P Le, DO   BP 147/82 mmHg  Pulse 86  Temp(Src) 97.8 F (36.6 C) (Oral)  Resp 18  SpO2 97%  LMP 10/31/2015 Physical Exam  Constitutional: She is oriented to person, place, and time. She appears well-developed and well-nourished. No distress.  HENT:  Head: Normocephalic.  Mouth/Throat: Oropharynx is clear and moist.  Eyes: Conjunctivae and EOM are normal.  Cardiovascular: Normal rate, regular rhythm and intact distal pulses.   Pulmonary/Chest: Effort normal and breath sounds normal. No stridor.  Abdominal: Soft. Bowel sounds are normal.  Musculoskeletal: Normal range of motion. She exhibits edema and tenderness.  Puncture wound to volar aspect of left second digit just  distal MTP  Has swelling and tenderness to palpation on the dorsal and volar aspect around the metacarpal phalangeal joint of the second digit. Reduced range of motion, distally neurovascular intact, exquisite tenderness to palpation and pain with passive extension.  Neurological: She is alert and oriented to person, place, and time.  Psychiatric: She has a normal mood and affect.  Nursing note and vitals reviewed.   ED Course  Procedures (including critical care time) Labs Review Labs Reviewed  CBC WITH DIFFERENTIAL/PLATELET - Abnormal; Notable for the following:    RBC 5.93 (*)    MCV 68.5 (*)    MCH 21.1 (*)    All other components within normal  limits  BASIC METABOLIC PANEL - Abnormal; Notable for the following:    Glucose, Bld 329 (*)    All other components within normal limits    Imaging Review Dg Chest Port 1 View  11/18/2015  CLINICAL DATA:  Preop. EXAM: PORTABLE CHEST 1 VIEW COMPARISON:  None. FINDINGS: The heart size and mediastinal contours are within normal limits. Both lungs are clear. The visualized skeletal structures are unremarkable. IMPRESSION: No acute cardiopulmonary abnormality seen. Electronically Signed   By: Lupita RaiderJames  Green Jr, M.D.   On: 11/18/2015 13:53   Dg  Hand Complete Left  11/18/2015  CLINICAL DATA:  States she was using a pressure washer and the nozzle came back on her left hand. Pt has laceration to medial aspect/inside of 2nd digit, at the base of the finger. EXAM: LEFT HAND - COMPLETE 3+ VIEW COMPARISON:  None. FINDINGS: There is no evidence of fracture or dislocation. There is no evidence of arthropathy or other focal bone abnormality. Soft tissue emphysema at the base of the second phalanx extending into the volar and dorsal aspect of the hand. IMPRESSION: 1.  No acute osseous injury of the left and. 2. Soft tissue emphysema at the base of the second phalanx extending into the volar and dorsal aspect of the hand. Electronically Signed   By: Elige KoHetal  Patel   On: 11/18/2015 13:54   I have personally reviewed and evaluated these images and lab results as part of my medical decision-making.   EKG Interpretation None      MDM   Final diagnoses:  Elevated random blood glucose level  High-pressure injection injury of finger of left hand, initial encounter    Filed Vitals:   11/18/15 1420 11/18/15 1430 11/18/15 1445 11/18/15 1500  BP: 139/90 151/82 145/81 147/82  Pulse: 77 85 81 86  Temp:      TempSrc:      Resp: 18     SpO2: 97% 99% 97% 97%    Medications  HYDROmorphone (DILAUDID) injection 0.5 mg (0.5 mg Intravenous Given 11/18/15 1329)  sodium chloride 0.9 % bolus 1,000 mL (0 mLs Intravenous Stopped 11/18/15 1429)  Tdap (BOOSTRIX) injection 0.5 mL (0.5 mLs Intramuscular Given 11/18/15 1336)  ceFAZolin (ANCEF) IVPB 1 g/50 mL premix (1 g Intravenous New Bag/Given 11/18/15 1434)    Diana Burns is 55 y.o. female presenting with Eye pressure injection injury to left (nondominant hand), concern for compartment syndrome given the severity of her pain, she is distally neurovascularly intact, the affected joint is the left second digit the entrance wound is on the volar aspect just distal to the second metacarpal phalangeal joint, she has  significant swelling proximal to the metacarpal phalangeal joint.  Patient is made nothing by mouth, will obtain preop labs and x-ray.  After patient is given Dilaudid she appears significantly more comfortable, there is better range of motion to the joint. Discussed with Dr. Merlyn LotKuzma who will come to evaluate the patient but given that it was only water in the pressure washer he is full that she will not require surgery.  Patient's blood sugar is significantly elevated, states that she follows at [facility] in urgent care will draw A1c and I've advised her that she will need to have her family practice doc follow this up.  Dr. Merlyn LotKuzma has evaluated the patient and will reexamine her in approximately 30 minutes, she will likely be discharged home with a soft dressing antibiotics pain medication and outpatient follow-up with him. Case signed out to  PA Browning at shift change.             Wynetta Emery, PA-C 11/18/15 1631  Tilden Fossa, MD 11/19/15 [phone]

## 2015-11-18 NOTE — ED Provider Notes (Signed)
Dr. Merlyn LotKuzma recommends local wound care, Steri-Strips, and bulky gauze dressing. Will prescribe doxycycline. Patient has pain medicine. She has appropriate follow-up with Dr. Merlyn LotKuzma.  Roxy Horsemanobert Callaway Hailes, PA-C 11/18/15 1833  Laurence Spatesachel Morgan Little, MD 11/20/15 [phone]

## 2015-11-18 NOTE — ED Notes (Signed)
States she was using a pressure washer and the nozzle came back on her left hand. States she has a laceration to left index finger. Bleeding controled

## 2015-11-18 NOTE — Progress Notes (Signed)
Patient ID: Diana Burns, female   DOB: 1961/02/13, 55 y.o.   MRN: [id number] Reevaluation of left index finger injection injury.  Continued improvement in pain level.  No worsening of symptoms.  Finger continues to have good capillary refill and sensation.  Able to flex at pip and dip joints.  Finger and hand soft without vascular compromise.  Will plan d/c home.  Wound will be cleansed by ED and steri strip placed.  Suturing not chosen to avoid need for injection of area with numbing medication.  Follow up in office this week.  Call or return to ED for any concern of worsening symptoms.

## 2015-11-18 NOTE — Discharge Instructions (Signed)
Your diabetes testing was drawn today but will not result today, your primary care physician will have to follow-up these results please make an appointment in the next week for evaluation.  Wash the affected area with soap and water and apply a thin layer of topical antibiotic ointment. Do this every 12 hours.   Do not use rubbing alcohol or hydrogen peroxide.                        Look for signs of infection: if you see redness, if the area becomes warm, if pain increases sharply, there is discharge (pus), if red streaks appear or you develop fever or vomiting, RETURN immediately to the Emergency Department  for a recheck.   Rest, Ice intermittently (in the first 24-48 hours), Gentle compression with an Ace wrap, and elevate (Limb above the level of the heart)   Take up to 800mg  of ibuprofen (that is usually 4 over the counter pills)  3 times a day for 5 days. Take with food.  Take vicodin for breakthrough pain, do not drink alcohol, drive, care for children or do other critical tasks while taking vicodin.  Please follow with your primary care doctor in the next 2 days for a check-up. They must obtain records for further management.   Do not hesitate to return to the Emergency Department for any new, worsening or concerning symptoms.

## 2015-11-21 LAB — HEMOGLOBIN A1C
HEMOGLOBIN A1C: 14.8 % — AB (ref 4.8–5.6)
MEAN PLASMA GLUCOSE: 378 mg/dL

## 2015-11-22 NOTE — H&P (Signed)
TOTAL KNEE ADMISSION H&P  Patient is being admitted for left total knee arthroplasty.  Subjective:  Chief Complaint:left knee pain.  HPI: Diana Burns, 55 y.o. female, has a history of pain and functional disability in the left knee due to arthritis and has failed non-surgical conservative treatments for greater than 12 weeks to includeNSAID's and/or analgesics, corticosteriod injections, supervised PT with diminished ADL's post treatment, use of assistive devices, weight reduction as appropriate and activity modification.  Onset of symptoms was gradual, starting 4 years ago with gradually worsening course since that time. The patient noted prior procedures on the knee to include  arthroscopy on the left knee(s).  Patient currently rates pain in the left knee(s) at 6 out of 10 with activity. Patient has worsening of pain with activity and weight bearing, pain that interferes with activities of daily living, pain with passive range of motion and joint swelling.  Patient has evidence of subchondral sclerosis, periarticular osteophytes and joint space narrowing by imaging studies.  There is no active infection.  There are no active problems to display for this patient.  Past Medical History  Diagnosis Date  . Allergy   . Anemia   . Arthritis     Past Surgical History  Procedure Laterality Date  . Knee surgery      No prescriptions prior to admission   Allergies  Allergen Reactions  . Aspartame And Phenylalanine Other (See Comments)    Artificial sweetners - headache  . Bee Venom Swelling  . Latex Hives and Itching    Social History  Substance Use Topics  . Smoking status: Never Smoker   . Smokeless tobacco: Not on file  . Alcohol Use: Yes    Family History  Problem Relation Age of Onset  . Cancer Mother   . Heart disease Mother   . Hyperlipidemia Mother   . Hypertension Mother      Review of Systems  Constitutional: Negative.   HENT: Negative.   Eyes: Negative.    Respiratory: Negative.   Cardiovascular: Negative.   Gastrointestinal: Negative.   Genitourinary: Negative.   Musculoskeletal: Positive for joint pain.  Skin: Negative.   Neurological: Negative.   Endo/Heme/Allergies: Negative.   Psychiatric/Behavioral: Negative.     Objective:  Physical Exam  Constitutional: She is oriented to person, place, and time. She appears well-developed.  HENT:  Head: Normocephalic.  Eyes: EOM are normal.  Neck: Normal range of motion.  Cardiovascular: Normal rate and intact distal pulses.   Respiratory: Effort normal.  GI: Soft.  Genitourinary:  Deferred  Musculoskeletal:  Left knee pain with rom. Knee is stable. LLE grossly n/v intact.   Neurological: She is alert and oriented to person, place, and time. She has normal reflexes.  Skin: Skin is warm and dry.  Psychiatric: Her behavior is normal.    Vital signs in last 24 hours:    Labs:   Estimated body mass index is 38.65 kg/(m^2) as calculated from the following:   Height as of 09/25/15: 5' 8.5" (1.74 m).   Weight as of 09/25/15: 117.028 kg (258 lb).   Imaging Review Plain radiographs demonstrate moderate degenerative joint disease of the left knee(s). The overall alignment ismild varus. The bone quality appears to be good for age and reported activity level.  Assessment/Plan:  End stage arthritis, left knee   The patient history, physical examination, clinical judgment of the provider and imaging studies are consistent with end stage degenerative joint disease of the left knee(s) and total knee arthroplasty  is deemed medically necessary. The treatment options including medical management, injection therapy arthroscopy and arthroplasty were discussed at length. The risks and benefits of total knee arthroplasty were presented and reviewed. The risks due to aseptic loosening, infection, stiffness, patella tracking problems, thromboembolic complications and other imponderables were discussed.  The patient acknowledged the explanation, agreed to proceed with the plan and consent was signed. Patient is being admitted for inpatient treatment for surgery, pain control, PT, OT, prophylactic antibiotics, VTE prophylaxis, progressive ambulation and ADL's and discharge planning. The patient is planning to be discharged home with home health services.  Will use IV tranexamic acid. Contraindications and adverse affects of Tranexamic acid discussed in detail. Patient denies any of these at this time and understands the risks and benefits.

## 2015-11-27 ENCOUNTER — Ambulatory Visit (INDEPENDENT_AMBULATORY_CARE_PROVIDER_SITE_OTHER): Payer: 59 | Admitting: Emergency Medicine

## 2015-11-27 VITALS — BP 143/88 | HR 91 | Temp 98.1°F | Resp 16 | Ht 68.5 in | Wt 261.0 lb

## 2015-11-27 DIAGNOSIS — E1165 Type 2 diabetes mellitus with hyperglycemia: Secondary | ICD-10-CM

## 2015-11-27 DIAGNOSIS — G8929 Other chronic pain: Secondary | ICD-10-CM | POA: Diagnosis not present

## 2015-11-27 DIAGNOSIS — M25562 Pain in left knee: Secondary | ICD-10-CM

## 2015-11-27 DIAGNOSIS — Z1159 Encounter for screening for other viral diseases: Secondary | ICD-10-CM | POA: Diagnosis not present

## 2015-11-27 DIAGNOSIS — E118 Type 2 diabetes mellitus with unspecified complications: Secondary | ICD-10-CM | POA: Diagnosis not present

## 2015-11-27 DIAGNOSIS — E049 Nontoxic goiter, unspecified: Secondary | ICD-10-CM

## 2015-11-27 DIAGNOSIS — IMO0002 Reserved for concepts with insufficient information to code with codable children: Secondary | ICD-10-CM

## 2015-11-27 LAB — COMPLETE METABOLIC PANEL WITH GFR
ALBUMIN: 4.1 g/dL (ref 3.6–5.1)
ALK PHOS: 60 U/L (ref 33–130)
ALT: 21 U/L (ref 6–29)
AST: 17 U/L (ref 10–35)
BUN: 13 mg/dL (ref 7–25)
CALCIUM: 9.3 mg/dL (ref 8.6–10.4)
CO2: 27 mmol/L (ref 20–31)
Chloride: 100 mmol/L (ref 98–110)
Creat: 0.53 mg/dL (ref 0.50–1.05)
Glucose, Bld: 288 mg/dL — ABNORMAL HIGH (ref 65–99)
POTASSIUM: 4.1 mmol/L (ref 3.5–5.3)
Sodium: 137 mmol/L (ref 135–146)
Total Bilirubin: 0.6 mg/dL (ref 0.2–1.2)
Total Protein: 7.2 g/dL (ref 6.1–8.1)

## 2015-11-27 LAB — LIPID PANEL
CHOL/HDL RATIO: 2.6 ratio (ref <=5.0)
CHOLESTEROL: 174 mg/dL (ref 125–200)
HDL: 67 mg/dL (ref >=46)
LDL Cholesterol: 96 mg/dL (ref <130)
TRIGLYCERIDES: 54 mg/dL (ref <150)
VLDL: 11 mg/dL (ref <30)

## 2015-11-27 LAB — HEPATITIS C ANTIBODY: HCV Ab: NEGATIVE

## 2015-11-27 LAB — GLUCOSE, POCT (MANUAL RESULT ENTRY): POC Glucose: 309 mg/dl — AB (ref 70–99)

## 2015-11-27 MED ORDER — BLOOD GLUCOSE MONITOR KIT: PACK | Status: AC

## 2015-11-27 MED ORDER — METFORMIN HCL ER 500 MG PO TB24: ORAL_TABLET | ORAL | Status: DC

## 2015-11-27 MED ORDER — ATORVASTATIN CALCIUM 20 MG PO TABS: 20.0000 mg | ORAL_TABLET | Freq: Every day | ORAL | Status: DC

## 2015-11-27 NOTE — Patient Instructions (Addendum)
I started Geodon Glucophage for diabetes. I started July Lipitor one a day. . Please start baby aspirin 81 mg 1 a day. I have made you a referral to endocrinology to help with diabetes control and evaluate your goiter.Type 2 Diabetes Mellitus, Adult Type 2 diabetes mellitus, often simply referred to as type 2 diabetes, is a long-lasting (chronic) disease. In type 2 diabetes, the pancreas does not make enough insulin (a hormone), the cells are less responsive to the insulin that is made (insulin resistance), or both. Normally, insulin moves sugars from food into the tissue cells. The tissue cells use the sugars for energy. The lack of insulin or the lack of normal response to insulin causes excess sugars to build up in the blood instead of going into the tissue cells. As a result, high blood sugar (hyperglycemia) develops. The effect of high sugar (glucose) levels can cause many complications. Type 2 diabetes was also previously called adult-onset diabetes, but it can occur at any age.  RISK FACTORS  A person is predisposed to developing type 2 diabetes if someone in the family has the disease and also has one or more of the following primary risk factors:  Weight gain, or being overweight or obese.  An inactive lifestyle.  A history of consistently eating high-calorie foods. Maintaining a normal weight and regular physical activity can reduce the chance of developing type 2 diabetes. SYMPTOMS  A person with type 2 diabetes may not show symptoms initially. The symptoms of type 2 diabetes appear slowly. The symptoms include:  Increased thirst (polydipsia).  Increased urination (polyuria).  Increased urination during the night (nocturia).  Sudden or unexplained weight changes.  Frequent, recurring infections.  Tiredness (fatigue).  Weakness.  Vision changes, such as blurred vision.  Fruity smell to your breath.  Abdominal pain.  Nausea or vomiting.  Cuts or bruises which are  slow to heal.  Tingling or numbness in the hands or feet.  An open skin wound (ulcer). DIAGNOSIS Type 2 diabetes is frequently not diagnosed until complications of diabetes are present. Type 2 diabetes is diagnosed when symptoms or complications are present and when blood glucose levels are increased. Your blood glucose level may be checked by one or more of the following blood tests:  A fasting blood glucose test. You will not be allowed to eat for at least 8 hours before a blood sample is taken.  A random blood glucose test. Your blood glucose is checked at any time of the day regardless of when you ate.  A hemoglobin A1c blood glucose test. A hemoglobin A1c test provides information about blood glucose control over the previous 3 months.  An oral glucose tolerance test (OGTT). Your blood glucose is measured after you have not eaten (fasted) for 2 hours and then after you drink a glucose-containing beverage. TREATMENT   You may need to take insulin or diabetes medicine daily to keep blood glucose levels in the desired range.  If you use insulin, you may need to adjust the dosage depending on the carbohydrates that you eat with each meal or snack.  Lifestyle changes are recommended as part of your treatment. These may include:  Following an individualized diet plan developed by a nutritionist or dietitian.  Exercising daily. Your health care providers will set individualized treatment goals for you based on your age, your medicines, how long you have had diabetes, and any other medical conditions you have. Generally, the goal of treatment is to maintain the following blood glucose  levels:  Before meals (preprandial): 80-130 mg/dL.  After meals (postprandial): below 180 mg/dL.  A1c: less than 6.5-7%. HOME CARE INSTRUCTIONS   Have your hemoglobin A1c level checked twice a year.  Perform daily blood glucose monitoring as directed by your health care provider.  Monitor urine  ketones when you are ill and as directed by your health care provider.  Take your diabetes medicine or insulin as directed by your health care provider to maintain your blood glucose levels in the desired range.  Never run out of diabetes medicine or insulin. It is needed every day.  If you are using insulin, you may need to adjust the amount of insulin given based on your intake of carbohydrates. Carbohydrates can raise blood glucose levels but need to be included in your diet. Carbohydrates provide vitamins, minerals, and fiber which are an essential part of a healthy diet. Carbohydrates are found in fruits, vegetables, whole grains, dairy products, legumes, and foods containing added sugars.  Eat healthy foods. You should make an appointment to see a registered dietitian to help you create an eating plan that is right for you.  Lose weight if you are overweight.  Carry a medical alert card or wear your medical alert jewelry.  Carry a 15-gram carbohydrate snack with you at all times to treat low blood glucose (hypoglycemia). Some examples of 15-gram carbohydrate snacks include:  Glucose tablets, 3 or 4.  Glucose gel, 15-gram tube.  Raisins, 2 tablespoons (24 grams).  Jelly beans, 6.  Animal crackers, 8.  Regular pop, 4 ounces (120 mL).  Gummy treats, 9.  Recognize hypoglycemia. Hypoglycemia occurs with blood glucose levels of 70 mg/dL and below. The risk for hypoglycemia increases when fasting or skipping meals, during or after intense exercise, and during sleep. Hypoglycemia symptoms can include:  Tremors or shakes.  Decreased ability to concentrate.  Sweating.  Increased heart rate.  Headache.  Dry mouth.  Hunger.  Irritability.  Anxiety.  Restless sleep.  Altered speech or coordination.  Confusion.  Treat hypoglycemia promptly. If you are alert and able to safely swallow, follow the 15:15 rule:  Take 15-20 grams of rapid-acting glucose or carbohydrate.  Rapid-acting options include glucose gel, glucose tablets, or 4 ounces (120 mL) of fruit juice, regular soda, or low-fat milk.  Check your blood glucose level 15 minutes after taking the glucose.  Take 15-20 grams more of glucose if the repeat blood glucose level is still 70 mg/dL or below.  Eat a meal or snack within 1 hour once blood glucose levels return to normal.  Be alert to feeling very thirsty and urinating more frequently than usual, which are early signs of hyperglycemia. An early awareness of hyperglycemia allows for prompt treatment. Treat hyperglycemia as directed by your health care provider.  Engage in at least 150 minutes of moderate-intensity physical activity a week, spread over at least 3 days of the week or as directed by your health care provider. In addition, you should engage in resistance exercise at least 2 times a week or as directed by your health care provider. Try to spend no more than 90 minutes at one time inactive.  Adjust your medicine and food intake as needed if you start a new exercise or sport.  Follow your sick-day plan anytime you are unable to eat or drink as usual.  Do not use any tobacco products including cigarettes, chewing tobacco, or electronic cigarettes. If you need help quitting, ask your health care provider.  Limit alcohol intake  to no more than 1 drink per day for nonpregnant women and 2 drinks per day for men. You should drink alcohol only when you are also eating food. Talk with your health care provider whether alcohol is safe for you. Tell your health care provider if you drink alcohol several times a week.  Keep all follow-up visits as directed by your health care provider. This is important.  Schedule an eye exam soon after the diagnosis of type 2 diabetes and then annually.  Perform daily skin and foot care. Examine your skin and feet daily for cuts, bruises, redness, nail problems, bleeding, blisters, or sores. A foot exam by a  health care provider should be done annually.  Brush your teeth and gums at least twice a day and floss at least once a day. Follow up with your dentist regularly.  Share your diabetes management plan with your workplace or school.  Keep your immunizations up to date. It is recommended that you receive a flu (influenza) vaccine every year. It is also recommended that you receive a pneumonia (pneumococcal) vaccine. If you are 61 years of age or older and have never received a pneumonia vaccine, this vaccine may be given as a series of two separate shots. Ask your health care provider which additional vaccines may be recommended.  Learn to manage stress.  Obtain ongoing diabetes education and support as needed.  Participate in or seek rehabilitation as needed to maintain or improve independence and quality of life. Request a physical or occupational therapy referral if you are having foot or hand numbness, or difficulties with grooming, dressing, eating, or physical activity. SEEK MEDICAL CARE IF:   You are unable to eat food or drink fluids for more than 6 hours.  You have nausea and vomiting for more than 6 hours.  Your blood glucose level is over 240 mg/dL.  There is a change in mental status.  You develop an additional serious illness.  You have diarrhea for more than 6 hours.  You have been sick or have had a fever for a couple of days and are not getting better.  You have pain during any physical activity.  SEEK IMMEDIATE MEDICAL CARE IF:  You have difficulty breathing.  You have moderate to large ketone levels.   This information is not intended to replace advice given to you by your health care provider. Make sure you discuss any questions you have with your health care provider.   Document Released: 06/10/2005 Document Revised: 03/01/2015 Document Reviewed: 01/07/2012 Elsevier Interactive Patient Education 2016 [employer].     IF you received an x-ray today, you  will receive an invoice from [facility]. Please contact [facility] at [phone] with questions or concerns regarding your invoice.   IF you received labwork today, you will receive an invoice from [employer]. Please contact [facility] at [phone] with questions or concerns regarding your invoice.   Our billing staff will not be able to assist you with questions regarding bills from these companies.  You will be contacted with the lab results as soon as they are available. The fastest way to get your results is to activate your My Chart account. Instructions are located on the last page of this paperwork. If you have not heard from [place] regarding the results in 2 weeks, please contact this office.

## 2015-11-27 NOTE — Progress Notes (Signed)
Subjective:  This chart was scribed for Lesle Chris MD, by Veverly Fells, at [facility].  This patient was seen in room 13 and the patient's care was started at 1:34 PM.   Chief Complaint  Patient presents with  . blood work    pt would like to get her glucose checked     Patient ID: Diana Burns, female    DOB: 02-10-1961, 55 y.o.   MRN: [id number]  HPI HPI Comments: Diana Burns is a 55 y.o. female  who presents to the [facility] to have her glucose levels checked. 1 week ago Saturday, she had an accident with a pressure washer which tore into her finger and blasted water into her hand. The condition with her finger led her to find out that she has high blood sugar levels in the emergency department which had not been followed in the past.  She was going to have a total knee replacement but they decided to wait until her sugar was controlled. She has not noticed any difference with frequency during the night time and states that she only goes about one time.   Her mother has a history of high blood pressure. She does have family members who have had type 2 diabetes.  Patient has not had a physical in the last few years. She does not have a PCP. Patient is a Engineer, petroleum.     There are no active problems to display for this patient.  Past Medical History  Diagnosis Date  . Allergy   . Anemia   . Arthritis    Past Surgical History  Procedure Laterality Date  . Knee surgery     Allergies  Allergen Reactions  . Aspartame And Phenylalanine Other (See Comments)    Artificial sweetners - headache  . Bee Venom Swelling  . Latex Hives and Itching   Prior to Admission medications   Medication Sig Start Date End Date Taking? Authorizing Provider  doxycycline (VIBRAMYCIN) 100 MG capsule Take 1 capsule (100 mg total) by mouth 2 (two) times daily. 11/18/15  Yes Roxy Horseman, PA-C  HYDROcodone-acetaminophen (NORCO/VICODIN) 5-325  MG tablet Take 1-2 tablets by mouth every 6 hours as needed for pain. 11/18/15  Yes Nicole Pisciotta, PA-C  SUMAtriptan (IMITREX) 25 MG tablet Take 1 tablet (25 mg total) by mouth every 2 (two) hours as needed for migraine. May repeat in 2 hours if headache persists or recurs. No more than 200 mg in 24 hour period Patient not taking: Reported on 11/18/2015 01/23/15   Lenell Antu, DO   Social History   Social History  . Marital Status: Single    Spouse Name: N/A  . Number of Children: N/A  . Years of Education: N/A   Occupational History  . Not on file.   Social History Main Topics  . Smoking status: Never Smoker   . Smokeless tobacco: Not on file  . Alcohol Use: Yes  . Drug Use: No  . Sexual Activity: Not on file   Other Topics Concern  . Not on file   Social History Narrative   Raised between [place] and [place].        Review of Systems     Objective:   Physical Exam Filed Vitals:   11/27/15 1301  BP: 143/88  Pulse: 91  Temp: 98.1 F (36.7 C)  TempSrc: Oral  Resp: 16  Height: 5' 8.5" (1.74 m)  Weight: 261 lb (118.389 kg)  SpO2: 98%    CONSTITUTIONAL: tearful female HEAD: Normocephalic/atraumatic EYES: EOMI/PERRL ENMT: Mucous membranes moist NECK: symmetrically enlarged thyroid.  SPINE/BACK:entire spine nontender CV: S1/S2 noted, no murmurs/rubs/gallops noted LUNGS: clear ABDOMEN: soft, nontender, no rebound or guarding, bowel sounds noted throughout abdomen GU:no cva tenderness NEURO: Pt is awake/alert/appropriate, moves all extremitiesx4.  No facial droop.   EXTREMITIES:Bandage on her left index finger from a pressure washer injury.  SKIN: warm, color normal PSYCH: no abnormalities of mood noted, alert and oriented to situation  Results for orders placed or performed in visit on 11/27/15  POCT glucose (manual entry)  Result Value Ref Range   POC Glucose 309 (A) 70 - 99 mg/dl       Assessment & Plan:  Not clear to me how long she has been a  diabetic. Will start checking her sugars. She is given information about diabetes. Referral made to endocrinology. She was started on metformin at the tore and baby aspirin. Screening lipid panel microalbumin were done.I personally performed the services described in this documentation, which was scribed in my presence. The recorded information has been reviewed and is accurate.

## 2015-11-27 NOTE — Patient Instructions (Signed)
Diana Burns  11/27/2015   Your procedure is scheduled on: 12/07/2015    [facility]  [facility] to 3rd floor to  Short Stay Center at   1000 AM.  Call this number if you have problems the morning of surgery [phone]   Remember: ONLY 1 PERSON MAY GO WITH YOU TO SHORT STAY TO GET  READY MORNING OF YOUR SURGERY.  Do not eat food or drink liquids :After Midnight.     Take these medicines the morning of surgery with A SIP OF WATER: hydrocodone if needed                                 You may not have any metal on your body including hair pins and              piercings  Do not wear jewelry, make-up, lotions, powders or perfumes, deodorant             Do not wear nail polish.  Do not shave  48 hours prior to surgery.               Do not bring valuables to the hospital. [facility] IS NOT             RESPONSIBLE   FOR VALUABLES.  Contacts, dentures or bridgework may not be worn into surgery.  Leave suitcase in the car. After surgery it may be brought to your room.        Special Instructions: coughing and deep breathing exercises, leg exercises               Please read over the following fact sheets you were given: _____________________________________________________________________             [facility] - Preparing for [facility] Before surgery, you can play an important role.  Because skin is not sterile, your skin needs to be as free of germs as possible.  You can reduce the number of germs on your skin by washing with CHG (chlorahexidine gluconate) soap before surgery.  CHG is an antiseptic cleaner which kills germs and bonds with the skin to continue killing germs even after washing. Please DO NOT use if you have an allergy to CHG or antibacterial soaps.  If your skin becomes reddened/irritated stop using the CHG and inform your nurse when you arrive at Short Stay. Do not shave (including legs and underarms) for at  least 48 hours prior to the first CHG shower.  You may shave your face/neck. Please follow these instructions carefully:  1.  Shower with CHG Soap the night before surgery and the  morning of Surgery.  2.  If you choose to wash your hair, wash your hair first as usual with your  normal  shampoo.  3.  After you shampoo, rinse your hair and body thoroughly to remove the  shampoo.                           4.  Use CHG as you would any other liquid soap.  You can apply chg directly  to the skin and wash                       Gently with  a scrungie or clean washcloth.  5.  Apply the CHG Soap to your body ONLY FROM THE NECK DOWN.   Do not use on face/ open                           Wound or open sores. Avoid contact with eyes, ears mouth and genitals (private parts).                       Wash face,  Genitals (private parts) with your normal soap.             6.  Wash thoroughly, paying special attention to the area where your surgery  will be performed.  7.  Thoroughly rinse your body with warm water from the neck down.  8.  DO NOT shower/wash with your normal soap after using and rinsing off  the CHG Soap.                9.  Pat yourself dry with a clean towel.            10.  Wear clean pajamas.            11.  Place clean sheets on your bed the night of your first shower and do not  sleep with pets. Day of Surgery : Do not apply any lotions/deodorants the morning of surgery.  Please wear clean clothes to the hospital/surgery center.  FAILURE TO FOLLOW THESE INSTRUCTIONS MAY RESULT IN THE CANCELLATION OF YOUR SURGERY PATIENT SIGNATURE_________________________________  NURSE SIGNATURE__________________________________  ________________________________________________________________________  WHAT IS A BLOOD TRANSFUSION? Blood Transfusion Information  A transfusion is the replacement of blood or some of its parts. Blood is made up of multiple cells which provide different functions.  Red  blood cells carry oxygen and are used for blood loss replacement.  White blood cells fight against infection.  Platelets control bleeding.  Plasma helps clot blood.  Other blood products are available for specialized needs, such as hemophilia or other clotting disorders. BEFORE THE TRANSFUSION  Who gives blood for transfusions?   Healthy volunteers who are fully evaluated to make sure their blood is safe. This is blood bank blood. Transfusion therapy is the safest it has ever been in the practice of medicine. Before blood is taken from a donor, a complete history is taken to make sure that person has no history of diseases nor engages in risky social behavior (examples are intravenous drug use or sexual activity with multiple partners). The donor's travel history is screened to minimize risk of transmitting infections, such as malaria. The donated blood is tested for signs of infectious diseases, such as HIV and hepatitis. The blood is then tested to be sure it is compatible with you in order to minimize the chance of a transfusion reaction. If you or a relative donates blood, this is often done in anticipation of surgery and is not appropriate for emergency situations. It takes many days to process the donated blood. RISKS AND COMPLICATIONS Although transfusion therapy is very safe and saves many lives, the main dangers of transfusion include:  1. Getting an infectious disease. 2. Developing a transfusion reaction. This is an allergic reaction to something in the blood you were given. Every precaution is taken to prevent this. The decision to have a blood transfusion has been considered carefully by your caregiver before blood is given. Blood is not given unless the benefits outweigh the risks.  AFTER THE TRANSFUSION  Right after receiving a blood transfusion, you will usually feel much better and more energetic. This is especially true if your red blood cells have gotten low (anemic). The  transfusion raises the level of the red blood cells which carry oxygen, and this usually causes an energy increase.  The nurse administering the transfusion will monitor you carefully for complications. HOME CARE INSTRUCTIONS  No special instructions are needed after a transfusion. You may find your energy is better. Speak with your caregiver about any limitations on activity for underlying diseases you may have. SEEK MEDICAL CARE IF:   Your condition is not improving after your transfusion.  You develop redness or irritation at the intravenous (IV) site. SEEK IMMEDIATE MEDICAL CARE IF:  Any of the following symptoms occur over the next 12 hours:  Shaking chills.  You have a temperature by mouth above 102 F (38.9 C), not controlled by medicine.  Chest, back, or muscle pain.  People around you feel you are not acting correctly or are confused.  Shortness of breath or difficulty breathing.  Dizziness and fainting.  You get a rash or develop hives.  You have a decrease in urine output.  Your urine turns a dark color or changes to pink, red, or brown. Any of the following symptoms occur over the next 10 days:  You have a temperature by mouth above 102 F (38.9 C), not controlled by medicine.  Shortness of breath.  Weakness after normal activity.  The white part of the eye turns yellow (jaundice).  You have a decrease in the amount of urine or are urinating less often.  Your urine turns a dark color or changes to pink, red, or brown. Document Released: 06/07/2000 Document Revised: 09/02/2011 Document Reviewed: 01/25/2008 ExitCare Patient Information 2014 [place].  _______________________________________________________________________  Incentive Spirometer  An incentive spirometer is a tool that can help keep your lungs clear and active. This tool measures how well you are filling your lungs with each breath. Taking long deep breaths may help reverse or  decrease the chance of developing breathing (pulmonary) problems (especially infection) following:  A long period of time when you are unable to move or be active. BEFORE THE PROCEDURE   If the spirometer includes an indicator to show your best effort, your nurse or respiratory therapist will set it to a desired goal.  If possible, sit up straight or lean slightly forward. Try not to slouch.  Hold the incentive spirometer in an upright position. INSTRUCTIONS FOR USE  3. Sit on the edge of your bed if possible, or sit up as far as you can in bed or on a chair. 4. Hold the incentive spirometer in an upright position. 5. Breathe out normally. 6. Place the mouthpiece in your mouth and seal your lips tightly around it. 7. Breathe in slowly and as deeply as possible, raising the piston or the ball toward the top of the column. 8. Hold your breath for 3-5 seconds or for as long as possible. Allow the piston or ball to fall to the bottom of the column. 9. Remove the mouthpiece from your mouth and breathe out normally. 10. Rest for a few seconds and repeat Steps 1 through 7 at least 10 times every 1-2 hours when you are awake. Take your time and take a few normal breaths between deep breaths. 11. The spirometer may include an indicator to show your best effort. Use the indicator as a goal to work toward during  each repetition. 12. After each set of 10 deep breaths, practice coughing to be sure your lungs are clear. If you have an incision (the cut made at the time of surgery), support your incision when coughing by placing a pillow or rolled up towels firmly against it. Once you are able to get out of bed, walk around indoors and cough well. You may stop using the incentive spirometer when instructed by your caregiver.  RISKS AND COMPLICATIONS  Take your time so you do not get dizzy or light-headed.  If you are in pain, you may need to take or ask for pain medication before doing incentive  spirometry. It is harder to take a deep breath if you are having pain. AFTER USE  Rest and breathe slowly and easily.  It can be helpful to keep track of a log of your progress. Your caregiver can provide you with a simple table to help with this. If you are using the spirometer at home, follow these instructions: [place] IF:   You are having difficultly using the spirometer.  You have trouble using the spirometer as often as instructed.  Your pain medication is not giving enough relief while using the spirometer.  You develop fever of 100.5 F (38.1 C) or higher. SEEK IMMEDIATE MEDICAL CARE IF:   You cough up bloody sputum that had not been present before.  You develop fever of 102 F (38.9 C) or greater.  You develop worsening pain at or near the incision site. MAKE SURE YOU:   Understand these instructions.  Will watch your condition.  Will get help right away if you are not doing well or get worse. Document Released: 10/21/2006 Document Revised: 09/02/2011 Document Reviewed: 12/22/2006 [facility] Patient Information 2014 [place], [place].   ________________________________________________________________________

## 2015-11-27 NOTE — Progress Notes (Addendum)
BMP and HGA1C done 11/18/15 faxed via EPIC to Dr Carlynn Purl Collins. Also called office and left message on voice mail of Halford DecampLaurie Faust - [phone] regarding HGA1C results and glucose results on 11/18/2015.

## 2015-11-28 ENCOUNTER — Telehealth: Payer: Self-pay | Admitting: Emergency Medicine

## 2015-11-28 ENCOUNTER — Encounter (HOSPITAL_COMMUNITY)
Admission: RE | Admit: 2015-11-28 | Discharge: 2015-11-28 | Disposition: A | Discharge status: Home / Self Care | Payer: 59 | Source: Ambulatory Visit | Attending: Specialist | Admitting: Specialist

## 2015-11-28 LAB — TSH: TSH: 0.49 m[IU]/L

## 2015-11-28 LAB — MICROALBUMIN, URINE: MICROALB UR: 1.3 mg/dL

## 2015-11-28 NOTE — Telephone Encounter (Signed)
-----   Message from Collene GobbleSteven A Daub, MD sent at 11/28/2015  7:21 AM EDT ----- Labs normal except for elevated sugar

## 2015-11-28 NOTE — Telephone Encounter (Signed)
Pt given lab results Instructed to watch carbohydrates, diet and exercise to control blood sugars Continue taking diabetes medication

## 2015-12-07 ENCOUNTER — Inpatient Hospital Stay: Admit: 2015-12-07 | Payer: 59 | Admitting: Specialist

## 2015-12-07 SURGERY — ARTHROPLASTY, KNEE, TOTAL
Anesthesia: Choice | Site: Knee | Laterality: Left

## 2016-01-16 ENCOUNTER — Encounter: Payer: Self-pay | Admitting: Internal Medicine

## 2016-01-16 ENCOUNTER — Ambulatory Visit (INDEPENDENT_AMBULATORY_CARE_PROVIDER_SITE_OTHER): Payer: 59 | Admitting: Internal Medicine

## 2016-01-16 VITALS — BP 142/88 | HR 97 | Ht 67.5 in | Wt 265.0 lb

## 2016-01-16 DIAGNOSIS — E118 Type 2 diabetes mellitus with unspecified complications: Secondary | ICD-10-CM

## 2016-01-16 DIAGNOSIS — E1165 Type 2 diabetes mellitus with hyperglycemia: Secondary | ICD-10-CM | POA: Insufficient documentation

## 2016-01-16 DIAGNOSIS — IMO0002 Reserved for concepts with insufficient information to code with codable children: Secondary | ICD-10-CM

## 2016-01-16 MED ORDER — METFORMIN HCL ER 500 MG PO TB24: ORAL_TABLET | ORAL | 3 refills | Status: DC

## 2016-01-16 MED ORDER — SITAGLIPTIN PHOSPHATE 100 MG PO TABS: 100.0000 mg | ORAL_TABLET | Freq: Every day | ORAL | 5 refills | Status: DC

## 2016-01-16 NOTE — Patient Instructions (Signed)
Please increase Metformin ER to 1000 mg 2x a day.  Please add Januvia 100 mg before breakfast.  Please let me know if the sugars are consistently <80 or >200.  Please return in 1.5 months with your sugar log.   PATIENT INSTRUCTIONS FOR TYPE 2 DIABETES:  **Please join MyChart!** - see attached instructions about how to join if you have not done so already.  DIET AND EXERCISE Diet and exercise is an important part of diabetic treatment.  We recommended aerobic exercise in the form of brisk walking (working between 40-60% of maximal aerobic capacity, similar to brisk walking) for 150 minutes per week (such as 30 minutes five days per week) along with 3 times per week performing 'resistance' training (using various gauge rubber tubes with handles) 5-10 exercises involving the major muscle groups (upper body, lower body and core) performing 10-15 repetitions (or near fatigue) each exercise. Start at half the above goal but build slowly to reach the above goals. If limited by weight, joint pain, or disability, we recommend daily walking in a swimming pool with water up to waist to reduce pressure from joints while allow for adequate exercise.    BLOOD GLUCOSES Monitoring your blood glucoses is important for continued management of your diabetes. Please check your blood glucoses 2-4 times a day: fasting, before meals and at bedtime (you can rotate these measurements - e.g. one day check before the 3 meals, the next day check before 2 of the meals and before bedtime, etc.).   HYPOGLYCEMIA (low blood sugar) Hypoglycemia is usually a reaction to not eating, exercising, or taking too much insulin/ other diabetes drugs.  Symptoms include tremors, sweating, hunger, confusion, headache, etc. Treat IMMEDIATELY with 15 grams of Carbs: . 4 glucose tablets .  cup regular juice/soda . 2 tablespoons raisins . 4 teaspoons sugar . 1 tablespoon honey Recheck blood glucose in 15 mins and repeat above if still  symptomatic/blood glucose <100.  RECOMMENDATIONS TO REDUCE YOUR RISK OF DIABETIC COMPLICATIONS: * Take your prescribed MEDICATION(S) * Follow a DIABETIC diet: Complex carbs, fiber rich foods, (monounsaturated and polyunsaturated) fats * AVOID saturated/trans fats, high fat foods, >2,300 mg salt per day. * EXERCISE at least 5 times a week for 30 minutes or preferably daily.  * DO NOT SMOKE OR DRINK more than 1 drink a day. * Check your FEET every day. Do not wear tightfitting shoes. Contact us if you develop an ulcer * See your EYE doctor once a year or more if needed * Get a FLU shot once a year * Get a PNEUMONIA vaccine once before and once after age 52 years  GOALS:  * Your Hemoglobin A1c of <7%  * fasting sugars need to be <130 * after meals sugars need to be <180 (2h after you start eating) * Your Systolic BP should be 140 or lower  * Your Diastolic BP should be 80 or lower  * Your HDL (Good Cholesterol) should be 40 or higher  * Your LDL (Bad Cholesterol) should be 100 or lower. * Your Triglycerides should be 150 or lower  * Your Urine microalbumin (kidney function) should be <30 * Your Body Mass Index should be 25 or lower    Please consider the following ways to cut down carbs and fat and increase fiber and micronutrients in your diet: - substitute whole grain for white bread or pasta - substitute brown rice for white rice - substitute 90-calorie flat bread pieces for slices of bread when possible -  substitute sweet potatoes or yams for white potatoes - substitute humus for margarine - substitute tofu for cheese when possible - substitute almond or rice milk for regular milk (would not drink soy milk daily due to concern for soy estrogen influence on breast cancer risk) - substitute dark chocolate for other sweets when possible - substitute water - can add lemon or orange slices for taste - for diet sodas (artificial sweeteners will trick your body that you can eat sweets  without getting calories and will lead you to overeating and weight gain in the long run) - do not skip breakfast or other meals (this will slow down the metabolism and will result in more weight gain over time)  - can try smoothies made from fruit and almond/rice milk in am instead of regular breakfast - can also try old-fashioned (not instant) oatmeal made with almond/rice milk in am - order the dressing on the side when eating salad at a restaurant (pour less than half of the dressing on the salad) - eat as little meat as possible - can try juicing, but should not forget that juicing will get rid of the fiber, so would alternate with eating raw veg./fruits or drinking smoothies - use as little oil as possible, even when using olive oil - can dress a salad with a mix of balsamic vinegar and lemon juice, for e.g. - use agave nectar, stevia sugar, or regular sugar rather than artificial sweateners - steam or broil/roast veggies  - snack on veggies/fruit/nuts (unsalted, preferably) when possible, rather than processed foods - reduce or eliminate aspartame in diet (it is in diet sodas, chewing gum, etc) Read the labels!  Try to read Dr. Janene Harvey book: "Program for Reversing Diabetes" for other ideas for healthy eating.

## 2016-01-16 NOTE — Progress Notes (Signed)
Patient ID: Samreet Edenfield, female   DOB: September 18, 1960, 55 y.o.   MRN: [id number]   HPI: Markea Ruzich is a 55 y.o.-year-old female, referred by her PCP, Dr. Everlene Farrier for management of DM2, dx in 10/2015, non-insulin-dependent, uncontrolled, without complications.  Pt was recently dx'ed with DM after an injury to her finger >> ED >> labs showed a Glu in the 300. She then saw Dr. Everlene Farrier who checked and HbA1c which returned very high and he started her on metformin, which she continues today.  She had a period of few months of increased stress  - very busy, many deaths among family and close friends. She was eating once a day, sleeping 2-3 hours a day - working days and nights.  She also had steroid inj in knee (she will need to have total knee replacement soon), Prednisone for a tooth abscess, and Prednisone for laryngitis.  Last hemoglobin A1c was: Lab Results  Component Value Date   HGBA1C 14.8 (H) 11/18/2015   Pt is on a regimen of: - Metformin ER 500 mg 2x a day, with meals - started 11/2015 - Tolerated very well.  Pt checks her sugars 1-2 a day and they are: - am: 172-240 - 2h after b'fast: n/c - before lunch: n/c - 2h after lunch: n/c - before dinner: 170-240 (after Gatorade) - 2h after dinner: n/c - bedtime: n/c - nighttime: n/c No lows. Lowest sugar was 160; ? hypoglycemia awareness. Highest sugar was 240s lately.  Glucometer: One Touch  Pt's meals are: - Breakfast: Eggs, salmon, sausage, protein drink - Lunch: Gatorade, protein drink - Dinner: Chicken, do not, seafood, beef with rice, pasta, vegetables, salad - Snacks: 0-2, unsalted peanuts, pistachios  - no CKD, last BUN/creatinine:  Lab Results  Component Value Date   BUN 13 11/27/2015   BUN 9 11/18/2015   CREATININE 0.53 11/27/2015   CREATININE 0.62 11/18/2015   - last set of lipids: Lab Results  Component Value Date   CHOL 174 11/27/2015   HDL 67 11/27/2015   LDLCALC 96 11/27/2015   TRIG 54 11/27/2015   CHOLHDL  2.6 11/27/2015  On Lipitor. - last eye exam was in 02/2014. No DR.  - no numbness and tingling in her feet.  Pt has FH of DM in aunt and uncle.  ROS: Constitutional: + weight gain, + fatigue, no subjective hyperthermia/hypothermia Eyes: no blurry vision, no xerophthalmia ENT: no sore throat, no nodules palpated in throat, no dysphagia/odynophagia, no hoarseness Cardiovascular: no CP/SOB/palpitations/leg swelling Respiratory: no cough/SOB Gastrointestinal: no N/V/D/C Musculoskeletal: no muscle/+ joint aches - left knee Skin: no rashes Neurological: no tremors/numbness/tingling/dizziness Psychiatric: no depression/anxiety  Past Medical History:  Diagnosis Date  . Allergy   . Anemia   . Arthritis    Past Surgical History:  Procedure Laterality Date  . KNEE SURGERY     Social History   Social History  . Marital status: Single    Spouse name: N/A  . Number of children: 0   Occupational History  . Machine Garment/textile technologist    Social History Main Topics  . Smoking status: Never Smoker  . Smokeless tobacco: Not on file  . Alcohol use Yes - wine and mixed drinks rarely   . Drug use: No   Social History Narrative   Raised between [place] and [place].   Current Outpatient Prescriptions on File Prior to Visit  Medication Sig Dispense Refill  . atorvastatin (LIPITOR) 20 MG tablet Take 1 tablet (20 mg total) by mouth daily. [place]  tablet 3  . blood glucose meter kit and supplies KIT Dispense based on patient and insurance preference. Use up to four times daily as directed. (FOR ICD-9 250.00, 250.01). 1 each 0  . doxycycline (VIBRAMYCIN) 100 MG capsule Take 1 capsule (100 mg total) by mouth 2 (two) times daily. 20 capsule 0  . HYDROcodone-acetaminophen (NORCO/VICODIN) 5-325 MG tablet Take 1-2 tablets by mouth every 6 hours as needed for pain. 15 tablet 0  . SUMAtriptan (IMITREX) 25 MG tablet Take 1 tablet (25 mg total) by mouth every 2 (two) hours as needed for  migraine. May repeat in 2 hours if headache persists or recurs. No more than 200 mg in 24 hour period (Patient not taking: Reported on 11/18/2015) 10 tablet 1   No current facility-administered medications on file prior to visit.    Allergies  Allergen Reactions  . Aspartame And Phenylalanine Other (See Comments)    Artificial sweetners - headache  . Bee Venom Swelling  . Latex Hives and Itching  . Phenylalanine Other (See Comments)    Artificial sweetners - headache   Family History  Problem Relation Age of Onset  . Cancer Mother   . Heart disease Mother   . Hyperlipidemia Mother   . Hypertension Mother    PE: BP (!) 142/88 (BP Location: Left Arm, Patient Position: Sitting)   Pulse 97   Ht 5' 7.5" (1.715 m)   Wt 265 lb (120.2 kg)   LMP 01/02/2016   SpO2 97%   BMI 40.89 kg/m  Wt Readings from Last 3 Encounters:  01/16/16 265 lb (120.2 kg)  11/27/15 261 lb (118.4 kg)  09/25/15 258 lb (117 kg)   Constitutional: Obese, in NAD Eyes: PERRLA, EOMI, no exophthalmos ENT: moist mucous membranes, no thyromegaly, no cervical lymphadenopathy Cardiovascular: RRR, No MRG, + B LE edema + 3 Respiratory: CTA B Gastrointestinal: abdomen soft, NT, ND, BS+ Musculoskeletal: no deformities, strength intact in all 4 Skin: moist, warm, no rashes Neurological: no tremor with outstretched hands, DTR normal in all 4  ASSESSMENT: 1. DM2, non-insulin-dependent, uncontrolled, without long term complications but with hyperglycemia  PLAN:  1. Patient with new dx of diabetes,  after periods of increased stress, significant fatigue, poor diet, and several courses of steroids. She is now on oral antidiabetic regimen, with improvement in control after starting metformin. After diagnosis, she started to improve her diet, but she just noticed that the Gatorade that she is drinking has a lot of carbs >> she stopped drinking this. We did discuss at length about improving her diet and I made specific  suggestions for this. We also discussed about the fact that a 7% weight loss would be ideal, but even a 5% weight loss will help her bring her diabetes under control. I offered to refer her to nutrition, but she would like to continue with her diet efforts first.  She is very anxious about her diagnosis and is interested whether she can reverse her diabetes. I advised her that this can be kept under control for a long time, provided that she works hard on her diet and exercise.  - She is on half maximal dose of metformin, which we can increase to the target dose of 2000 mg a day.I would also suggest to add Januvia to help with postprandial sugars. She agrees with the above changes.  - I suggested to:  Patient Instructions  Please increase Metformin ER to 1000 mg 2x a day.  Please add Januvia 100 mg before  breakfast.  Please let me know if the sugars are consistently <80 or >200.  Please return in 1.5 months with your sugar log.   - Strongly advised her to start checking sugars at different times of the day - check 2 times a day, rotating checks - given sugar log and advised how to fill it and to bring it at next appt  - given foot care handout and explained the principles  - given instructions for hypoglycemia management "15-15 rule"  - advised for yearly eye exams  - Return to clinic in 1.5 mo with sugar log   Philemon Kingdom, MD PhD [facility]

## 2016-03-04 LAB — HM DIABETES EYE EXAM

## 2016-03-05 ENCOUNTER — Ambulatory Visit (INDEPENDENT_AMBULATORY_CARE_PROVIDER_SITE_OTHER): Payer: 59 | Admitting: Internal Medicine

## 2016-03-05 ENCOUNTER — Encounter: Payer: Self-pay | Admitting: Internal Medicine

## 2016-03-05 VITALS — BP 130/82 | HR 66 | Ht 68.0 in | Wt 257.0 lb

## 2016-03-05 DIAGNOSIS — E1165 Type 2 diabetes mellitus with hyperglycemia: Secondary | ICD-10-CM | POA: Diagnosis not present

## 2016-03-05 LAB — POCT GLYCOSYLATED HEMOGLOBIN (HGB A1C): Hemoglobin A1C: 8.8

## 2016-03-05 NOTE — Patient Instructions (Addendum)
Please continue: - Metformin ER to 1000 mg 2x a day. - Januvia 100 mg before breakfast.  Please return in 3 months with your sugar log.   You are now cleared for your knee surgery from the diabetes point of view.

## 2016-03-05 NOTE — Progress Notes (Signed)
Patient ID: Diana Burns, female   DOB: 13-Nov-1960, 55 y.o.   MRN: [id number]   HPI: Diana Burns is a 55 y.o.-year-old female, referred by her PCP, Dr. Everlene Farrier for management of DM2, dx in 10/2015, non-insulin-dependent, uncontrolled, without complications.  Pt was recently dx'ed with DM after an injury to her finger >> ED >> labs showed a Glu in the 300. Before diagnosis, She had a period of few months of increased stress  - very busy, many deaths among family and close friends. She was eating once a day, sleeping 2-3 hours a day - working days and nights. She also had steroid inj in knee (she will need to have total knee replacement soon), Prednisone for a tooth abscess, and Prednisone for laryngitis.  Since last visit though, sugars have dramatically improved! She also lost 8 lbs! She did not exercise in last week b/c twisted the knee.   Last hemoglobin A1c was: Lab Results  Component Value Date   HGBA1C 14.8 (H) 11/18/2015   Pt is on a regimen of: - Metformin ER 500 mg 2x a day, with meals - started 11/2015 - Increased to 1000 g twice a day last visit - Januvia 100 mg in a.m. - started 12/2015  Pt checks her sugars 1-2 a day and they are: - am: 172-240 >> 107-127 - 2h after b'fast: n/c >> 128-143 - before lunch: n/c - 2h after lunch: n/c >> 139 - before dinner: 170-240 (after Gatorade) >> 101-128, 141 - 2h after dinner: n/c - bedtime: n/c - nighttime: n/c No lows. Lowest sugar was 160 >> 101; ? hypoglycemia awareness. Highest sugar was 240s lately >> 141.  Glucometer: One Touch  Pt's meals are: - Breakfast: Eggs, salmon, sausage, protein drink - Lunch: Gatorade, protein drink - Dinner: Chicken, do not, seafood, beef with rice, pasta, vegetables, salad - Snacks: 0-2, unsalted peanuts, pistachios  - no CKD, last BUN/creatinine:  Lab Results  Component Value Date   BUN 13 11/27/2015   BUN 9 11/18/2015   CREATININE 0.53 11/27/2015   CREATININE 0.62 11/18/2015   - last set  of lipids: Lab Results  Component Value Date   CHOL 174 11/27/2015   HDL 67 11/27/2015   LDLCALC 96 11/27/2015   TRIG 54 11/27/2015   CHOLHDL 2.6 11/27/2015  On Lipitor. - last eye exam was in 02/2016. No DR.  - no numbness and tingling in her feet.  ROS: Constitutional: + weight loss, no fatigue, no subjective hyperthermia/hypothermia Eyes: no blurry vision, no xerophthalmia ENT: no sore throat, no nodules palpated in throat, no dysphagia/odynophagia, no hoarseness Cardiovascular: no CP/SOB/palpitations/leg swelling Respiratory: no cough/SOB Gastrointestinal: no N/V/+ D >> improved /C Musculoskeletal: no muscle/+ joint aches - left knee Skin: no rashes Neurological: no tremors/numbness/tingling/dizziness  I reviewed pt's medications, allergies, PMH, social hx, family hx, and changes were documented in the history of present illness. Otherwise, unchanged from my initial visit note.  Past Medical History:  Diagnosis Date  . Allergy   . Anemia   . Arthritis    Past Surgical History:  Procedure Laterality Date  . KNEE SURGERY     Social History   Social History  . Marital status: Single    Spouse name: N/A  . Number of children: 0   Occupational History  . Machine Garment/textile technologist    Social History Main Topics  . Smoking status: Never Smoker  . Smokeless tobacco: Not on file  . Alcohol use Yes - wine and mixed drinks rarely   .  Drug use: No   Social History Narrative   Raised between [place] and [place].   Current Outpatient Prescriptions on File Prior to Visit  Medication Sig Dispense Refill  . atorvastatin (LIPITOR) 20 MG tablet Take 1 tablet (20 mg total) by mouth daily. 90 tablet 3  . blood glucose meter kit and supplies KIT Dispense based on patient and insurance preference. Use up to four times daily as directed. (FOR ICD-9 250.00, 250.01). 1 each 0  . doxycycline (VIBRAMYCIN) 100 MG capsule Take 1 capsule (100 mg total) by mouth 2 (two) times  daily. 20 capsule 0  . HYDROcodone-acetaminophen (NORCO/VICODIN) 5-325 MG tablet Take 1-2 tablets by mouth every 6 hours as needed for pain. 15 tablet 0  . metFORMIN (GLUCOPHAGE XR) 500 MG 24 hr tablet Take 2 tablets x2 a day 360 tablet 3  . sitaGLIPtin (JANUVIA) 100 MG tablet Take 1 tablet (100 mg total) by mouth daily. 30 tablet 5   No current facility-administered medications on file prior to visit.    Allergies  Allergen Reactions  . Aspartame And Phenylalanine Other (See Comments)    Artificial sweetners - headache  . Bee Venom Swelling  . Latex Hives and Itching  . Phenylalanine Other (See Comments)    Artificial sweetners - headache   Family History  Problem Relation Age of Onset  . Cancer Mother   . Heart disease Mother   . Hyperlipidemia Mother   . Hypertension Mother    PE: BP 130/82 (BP Location: Right Arm, Patient Position: Sitting)   Pulse 66   Ht _0  (1.727 m)   Wt 257 lb (116.6 kg)   LMP 02/19/2016   BMI 39.08 kg/m  Wt Readings from Last 3 Encounters:  03/05/16 257 lb (116.6 kg)  01/16/16 265 lb (120.2 kg)  11/27/15 261 lb (118.4 kg)   Constitutional: Obese, in NAD Eyes: PERRLA, EOMI, no exophthalmos ENT: moist mucous membranes, no thyromegaly, no cervical lymphadenopathy Cardiovascular: RRR, No MRG, + B LE edema + 3 Respiratory: CTA B Gastrointestinal: abdomen soft, NT, ND, BS+ Musculoskeletal: no deformities, strength intact in all 4 Skin: moist, warm, no rashes Neurological: no tremor with outstretched hands, DTR normal in all 4  ASSESSMENT: 1. DM2, non-insulin-dependent, uncontrolled, without long term complications but with hyperglycemia  PLAN:  1. Patient with relatively new dx of diabetes,  after periods of increased stress, significant fatigue, poor diet, and several courses of steroids. 3 months ago, her HbA1c was higher than 14%. She was then started on oral antidiabetic regimen, with improvement in control. After diagnosis, she also  started to improve her diet, and before last visit she also stopped Gatorade. - At last visit, I offered to refer her to nutrition, but she wanted to continue with her diet efforts first.  - At last visit, we increased metformin to the target dose of 2000 mg a day. we also added Januvia to help with postprandial sugars.  - At this visit, sugars are great! She also lost 8 lbs - exercise, diet.  - I suggested to:  Patient Instructions  Please continue: - Metformin ER to 1000 mg 2x a day. - Januvia 100 mg before breakfast.  Please return in 3 months with your sugar log.   You are now cleared for your knee surgery from the diabetes point of view.  - continue checking sugars at different times of the day - check 1-2 times a day, rotating checks - advised for yearly eye exams >> she is UTD -  checked HbA1c today >> 8.8% (MUCH better!) - Return to clinic in 3 mo with sugar log   Philemon Kingdom, MD PhD [facility] Endocrinology

## 2016-03-05 NOTE — Addendum Note (Signed)
Addended by: Darene LamerHOMPSON, Oronde Hallenbeck T on: 03/05/2016 02:19 PM   Modules accepted: Orders

## 2016-04-24 ENCOUNTER — Ambulatory Visit: Payer: Self-pay | Admitting: Orthopedic Surgery

## 2016-05-21 ENCOUNTER — Encounter (HOSPITAL_COMMUNITY): Payer: Self-pay | Admitting: *Deleted

## 2016-06-04 ENCOUNTER — Ambulatory Visit: Payer: 59 | Admitting: Internal Medicine

## 2016-06-21 ENCOUNTER — Inpatient Hospital Stay: Admit: 2016-06-21 | Payer: 59 | Admitting: Specialist

## 2016-06-21 SURGERY — ARTHROPLASTY, KNEE, TOTAL
Anesthesia: Choice | Site: Knee | Laterality: Left

## 2016-07-09 ENCOUNTER — Telehealth: Payer: Self-pay | Admitting: Internal Medicine

## 2016-07-09 ENCOUNTER — Ambulatory Visit: Payer: 59 | Admitting: Internal Medicine

## 2016-07-09 NOTE — Telephone Encounter (Signed)
3mo

## 2016-07-09 NOTE — Telephone Encounter (Signed)
Patient no showed today's appt. Please advise on how to follow up. °A. No follow up necessary. °B. Follow up urgent. Contact patient immediately. °C. Follow up necessary. Contact patient and schedule visit in ___ days. °D. Follow up advised. Contact patient and schedule visit in ____weeks. ° °

## 2016-07-15 ENCOUNTER — Other Ambulatory Visit: Payer: Self-pay | Admitting: Internal Medicine

## 2016-07-19 ENCOUNTER — Ambulatory Visit: Payer: 59 | Admitting: Internal Medicine

## 2016-07-19 NOTE — Telephone Encounter (Signed)
No Show

## 2016-07-31 DIAGNOSIS — M1711 Unilateral primary osteoarthritis, right knee: Secondary | ICD-10-CM | POA: Diagnosis not present

## 2016-07-31 DIAGNOSIS — M1712 Unilateral primary osteoarthritis, left knee: Secondary | ICD-10-CM | POA: Diagnosis not present

## 2016-11-20 ENCOUNTER — Other Ambulatory Visit: Payer: Self-pay | Admitting: Emergency Medicine

## 2016-11-20 DIAGNOSIS — E1165 Type 2 diabetes mellitus with hyperglycemia: Secondary | ICD-10-CM

## 2016-11-20 DIAGNOSIS — E118 Type 2 diabetes mellitus with unspecified complications: Principal | ICD-10-CM

## 2016-11-20 DIAGNOSIS — IMO0002 Reserved for concepts with insufficient information to code with codable children: Secondary | ICD-10-CM

## 2016-11-21 ENCOUNTER — Ambulatory Visit (INDEPENDENT_AMBULATORY_CARE_PROVIDER_SITE_OTHER): Payer: 59 | Admitting: Internal Medicine

## 2016-11-21 ENCOUNTER — Encounter: Payer: Self-pay | Admitting: Internal Medicine

## 2016-11-21 VITALS — BP 148/90 | HR 94 | Ht 68.0 in | Wt 250.0 lb

## 2016-11-21 DIAGNOSIS — IMO0002 Reserved for concepts with insufficient information to code with codable children: Secondary | ICD-10-CM

## 2016-11-21 DIAGNOSIS — E1165 Type 2 diabetes mellitus with hyperglycemia: Secondary | ICD-10-CM

## 2016-11-21 DIAGNOSIS — E118 Type 2 diabetes mellitus with unspecified complications: Secondary | ICD-10-CM | POA: Diagnosis not present

## 2016-11-21 LAB — POCT GLYCOSYLATED HEMOGLOBIN (HGB A1C): HEMOGLOBIN A1C: 8.1

## 2016-11-21 MED ORDER — ATORVASTATIN CALCIUM 20 MG PO TABS: 20.0000 mg | ORAL_TABLET | Freq: Every day | ORAL | 3 refills | Status: DC

## 2016-11-21 NOTE — Patient Instructions (Addendum)
Your HbA1c is still a little high, at 8.1%.  Please continue: - Metformin ER to 1000 mg 2x a day. - Januvia 100 mg before breakfast.  Please return in 3 months with your sugar log.

## 2016-11-21 NOTE — Progress Notes (Signed)
Patient ID: Diana Burns, female   DOB: 1960-11-30, 56 y.o.   MRN: [id number]   HPI: Diana Burns is a 56 y.o.-year-old female, initially referred by her PCP, Dr. Everlene Farrier , now returning for follow-up for DM2, dx in 10/2015, non-insulin-dependent, uncontrolled, without complications. Last visit 8 months ago!  Since last visit, she lost weight by working out at [employer].  Reviewed history: Pt was dx'ed with DM after an injury to her finger >> ED >> labs showed a Glu in the 300. Before diagnosis, She had a period of few months of increased stress  - very busy, many deaths among family and close friends. She was eating once a day, sleeping 2-3 hours a day - working days and nights. She also had steroid inj in knee (she will need to have total knee replacement soon), Prednisone for a tooth abscess, and Prednisone for laryngitis.  Since diagnosis, she was able to improve her blood sugars, and also worked on her diet, so that last HbA1c from Fall 2017 has improved significantly. Unfortunately, she was lost for follow-up afterwards...  Last hemoglobin A1c was: Lab Results  Component Value Date   HGBA1C 8.8 03/05/2016   HGBA1C 14.8 (H) 11/18/2015   Pt is on a regimen of: - Metformin ER 1000 mg 2x a day - Januvia 100 mg in a.m.  In March she had a period when she was stretching her meds to last longer. Sugars were higher than, but improved afterwards.  Pt checks her sugars 1x a day: - am: 172-240 >> 107-127 >> 110-121 - 2h after b'fast: n/c >> 128-143 >> n/c - before lunch: n/c - 2h after lunch: n/c >> 139 n/c - before dinner: 170-240 (after Gatorade) >> 101-128, 141 >> 104-110 - 2h after dinner: n/c - bedtime: n/c - nighttime: n/c No lows. Lowest sugar was 160 >> 101; ? hypoglycemia awareness. Highest sugar was 240s lately >> 141 >> 170s (stress).  Glucometer: One Touch  Pt's meals are: - Breakfast: Eggs, salmon, sausage, but mostly protein shake - Lunch: salad or skips - Dinner: salad  or sandwich, burger, seafood - Snacks: 0-2, unsalted peanuts, pistachios She has a food log.  - No CKD, last BUN/creatinine:  Lab Results  Component Value Date   BUN 13 11/27/2015   BUN 9 11/18/2015   CREATININE 0.53 11/27/2015   CREATININE 0.62 11/18/2015   - last set of lipids: Lab Results  Component Value Date   CHOL 174 11/27/2015   HDL 67 11/27/2015   LDLCALC 96 11/27/2015   TRIG 54 11/27/2015   CHOLHDL 2.6 11/27/2015  She is on Lipitor. - last eye exam was in 02/2016. No DR. - She denies numbness and tingling in her feet.  ROS: Constitutional: + weight loss, no fatigue, no subjective hyperthermia, no subjective hypothermia Eyes: no blurry vision, no xerophthalmia ENT: no sore throat, no nodules palpated in throat, no dysphagia, no odynophagia, no hoarseness Cardiovascular: no CP/no SOB/no palpitations/no leg swelling Respiratory: no cough/no SOB/no wheezing Gastrointestinal: no N/no V/no D/no C/no acid reflux Musculoskeletal: no muscle aches/no joint aches Skin: no rashes, no hair loss Neurological: no tremors/no numbness/no tingling/no dizziness  I reviewed pt's medications, allergies, PMH, social hx, family hx, and changes were documented in the history of present illness. Otherwise, unchanged from my initial visit note.  Past Medical History:  Diagnosis Date  . Allergy   . Anemia   . Arthritis    Past Surgical History:  Procedure Laterality Date  . KNEE SURGERY  Social History   Social History  . Marital status: Single    Spouse name: N/A  . Number of children: 0   Occupational History  . Machine Garment/textile technologist    Social History Main Topics  . Smoking status: Never Smoker  . Smokeless tobacco: Not on file  . Alcohol use Yes - wine and mixed drinks rarely   . Drug use: No   Social History Narrative   Raised between [place] and [place].   Current Outpatient Prescriptions on File Prior to Visit  Medication Sig Dispense Refill   . atorvastatin (LIPITOR) 20 MG tablet Take 1 tablet (20 mg total) by mouth daily. 90 tablet 3  . blood glucose meter kit and supplies KIT Dispense based on patient and insurance preference. Use up to four times daily as directed. (FOR ICD-9 250.00, 250.01). 1 each 0  . doxycycline (VIBRAMYCIN) 100 MG capsule Take 1 capsule (100 mg total) by mouth 2 (two) times daily. 20 capsule 0  . HYDROcodone-acetaminophen (NORCO/VICODIN) 5-325 MG tablet Take 1-2 tablets by mouth every 6 hours as needed for pain. 15 tablet 0  . JANUVIA 100 MG tablet TAKE 1 TABLET (100 MG TOTAL) BY MOUTH DAILY. 30 tablet 5  . metFORMIN (GLUCOPHAGE XR) 500 MG 24 hr tablet Take 2 tablets x2 a day 360 tablet 3   No current facility-administered medications on file prior to visit.    Allergies  Allergen Reactions  . Aspartame And Phenylalanine Other (See Comments)    Artificial sweetners - headache  . Bee Venom Swelling  . Latex Hives and Itching  . Phenylalanine Other (See Comments)    Artificial sweetners - headache   Family History  Problem Relation Age of Onset  . Cancer Mother   . Heart disease Mother   . Hyperlipidemia Mother   . Hypertension Mother    PE: BP (!) 148/90 (BP Location: Left Arm, Patient Position: Sitting)   Pulse 94   Ht '5\' 8"'  (1.727 m)   Wt 250 lb (113.4 kg)   SpO2 97%   BMI 38.01 kg/m  Wt Readings from Last 3 Encounters:  11/21/16 250 lb (113.4 kg)  03/05/16 257 lb (116.6 kg)  01/16/16 265 lb (120.2 kg)   Constitutional: Obese, in NAD Eyes: PERRLA, EOMI, no exophthalmos ENT: moist mucous membranes, no thyromegaly, no cervical lymphadenopathy Cardiovascular: RRR, No MRG Respiratory: CTA B Gastrointestinal: abdomen soft, NT, ND, BS+ Musculoskeletal: no deformities, strength intact in all 4 Skin: moist, warm, no rashes Neurological: no tremor with outstretched hands, DTR normal in all 4   ASSESSMENT: 1. DM2, non-insulin-dependent, uncontrolled, without long term complications But  with hyperglycemia  PLAN:  1. Patient with relatively new diagnosis of diabetes, returning after a long absence. She had problems with not affording her medicines in March, when she was temporarily laid off from work. She is now back to work and has no problems affording her medicines. Her sugars improved after she restarted taking them. As of now, per her recall, her sugars are at goal. - She continues to workout at the gym and she mentions that per the scale there she lost more weight than indicated by our scale. She also continues to make healthier choices with her diet, eliminating concentrated sweets and fats.  - No change her regimen as needed for now, - I suggested to:  Patient Instructions  Your HbA1c is still a little high, at 8.1%.  Please continue: - Metformin ER to 1000 mg 2x a day. - Januvia  100 mg before breakfast.  Please return in 3 months with your sugar log.   - today, HbA1c is 8.1% (improved) - continue checking sugars at different times of the day - check 1x a day, rotating checks - advised for yearly eye exams >> she is UTD - Return to clinic in 3 mo with sugar log    Philemon Kingdom, MD PhD [facility] Endocrinology

## 2017-01-15 ENCOUNTER — Other Ambulatory Visit: Payer: Self-pay | Admitting: Internal Medicine

## 2017-01-15 DIAGNOSIS — E1165 Type 2 diabetes mellitus with hyperglycemia: Secondary | ICD-10-CM

## 2017-01-15 DIAGNOSIS — IMO0002 Reserved for concepts with insufficient information to code with codable children: Secondary | ICD-10-CM

## 2017-01-15 DIAGNOSIS — E118 Type 2 diabetes mellitus with unspecified complications: Principal | ICD-10-CM

## 2017-01-19 ENCOUNTER — Other Ambulatory Visit: Payer: Self-pay | Admitting: Internal Medicine

## 2017-02-27 ENCOUNTER — Ambulatory Visit (INDEPENDENT_AMBULATORY_CARE_PROVIDER_SITE_OTHER): Payer: 59 | Admitting: Internal Medicine

## 2017-02-27 ENCOUNTER — Encounter: Payer: Self-pay | Admitting: Internal Medicine

## 2017-02-27 VITALS — BP 130/82 | HR 79 | Ht 68.0 in | Wt 253.4 lb

## 2017-02-27 DIAGNOSIS — E669 Obesity, unspecified: Secondary | ICD-10-CM | POA: Diagnosis not present

## 2017-02-27 DIAGNOSIS — Z6838 Body mass index (BMI) 38.0-38.9, adult: Secondary | ICD-10-CM | POA: Diagnosis not present

## 2017-02-27 DIAGNOSIS — E66811 Obesity, class 1: Secondary | ICD-10-CM | POA: Insufficient documentation

## 2017-02-27 DIAGNOSIS — E1165 Type 2 diabetes mellitus with hyperglycemia: Secondary | ICD-10-CM

## 2017-02-27 DIAGNOSIS — IMO0001 Reserved for inherently not codable concepts without codable children: Secondary | ICD-10-CM

## 2017-02-27 LAB — COMPLETE METABOLIC PANEL WITH GFR
AG Ratio: 1.6 (calc) (ref 1.0–2.5)
ALBUMIN MSPROF: 4.3 g/dL (ref 3.6–5.1)
ALT: 23 U/L (ref 6–29)
AST: 19 U/L (ref 10–35)
Alkaline phosphatase (APISO): 49 U/L (ref 33–130)
BILIRUBIN TOTAL: 0.6 mg/dL (ref 0.2–1.2)
BUN: 13 mg/dL (ref 7–25)
CALCIUM: 9.4 mg/dL (ref 8.6–10.4)
CO2: 25 mmol/L (ref 20–32)
CREATININE: 0.59 mg/dL (ref 0.50–1.05)
Chloride: 103 mmol/L (ref 98–110)
GFR, EST AFRICAN AMERICAN: 121 mL/min/{1.73_m2} (ref >=60)
GFR, EST NON AFRICAN AMERICAN: 105 mL/min/{1.73_m2} (ref >=60)
GLUCOSE: 106 mg/dL — AB (ref 65–99)
Globulin: 2.7 g/dL (calc) (ref 1.9–3.7)
Potassium: 4.1 mmol/L (ref 3.5–5.3)
Sodium: 139 mmol/L (ref 135–146)
TOTAL PROTEIN: 7 g/dL (ref 6.1–8.1)

## 2017-02-27 LAB — LIPID PANEL
Cholesterol: 115 mg/dL (ref 0–200)
HDL: 55.3 mg/dL (ref >39.00)
LDL Cholesterol: 52 mg/dL (ref 0–99)
NonHDL: 59.48
Total CHOL/HDL Ratio: 2
Triglycerides: 36 mg/dL (ref 0.0–149.0)
VLDL: 7.2 mg/dL (ref 0.0–40.0)

## 2017-02-27 LAB — MICROALBUMIN / CREATININE URINE RATIO
Creatinine,U: 111.1 mg/dL
Microalb Creat Ratio: 2.3 mg/g (ref 0.0–30.0)
Microalb, Ur: 2.6 mg/dL — ABNORMAL HIGH (ref 0.0–1.9)

## 2017-02-27 LAB — POCT GLYCOSYLATED HEMOGLOBIN (HGB A1C)

## 2017-02-27 NOTE — Patient Instructions (Addendum)
Please continue: - Metformin ER 1000 mg 2x a day - Januvia 100 mg in am  Please look into "The Engine 2 Diet" by Juanetta Beetsip Esselstyn.  Please return in 3 months with your sugar log.

## 2017-02-27 NOTE — Progress Notes (Addendum)
Patient ID: Palmira Stickle, female   DOB: 1960-07-12, 56 y.o.   MRN: [id number]   HPI: Kynli Chou is a 56 y.o.-year-old female, initially referred by her PCP, Dr. Everlene Farrier , now returning for follow-up for DM2, dx in 10/2015, non-insulin-dependent, uncontrolled, without long term complications. Last visit 3 mo ago.  Since before last visit>> she joined a gym and is also struggling to improve her diet. She asked her PCP about GBP but she was told she does not quite qualify.  Reviewed hx: Pt was dx'ed with DM after an injury to her finger >> ED >> labs showed a Glu in the 300. Before diagnosis, She had a period of few months of increased stress  - very busy, many deaths among family and close friends. She was eating once a day, sleeping 2-3 hours a day - working days and nights. She also had steroid inj in knee (she will need to have total knee replacement soon), Prednisone for a tooth abscess, and Prednisone for laryngitis.  Since diagnosis, she was able to improve her blood sugars, and also worked on her diet, so that last HbA1c from Fall 2017 has improved significantly. Unfortunately, she was lost to follow-up afterwards until 10/2016.  Last hemoglobin A1c was: Lab Results  Component Value Date   HGBA1C 8.1 11/21/2016   HGBA1C 8.8 03/05/2016   HGBA1C 14.8 (H) 11/18/2015   Pt is on a regimen of: - Metformin ER 1000 mg 2x a day - Januvia 100 mg in a.m.   Pt checks her sugars 1x a day: - am: 172-240 >> 107-127 >> 110-121 >> 73, 100-120 - 2h after b'fast: n/c >> 128-143 >> n/c - before lunch: n/c - 2h after lunch: n/c >> 139 >> n/c - before dinner: 170-240 (after Gatorade) >> 101-128, 141 >> 104-110 >> 103-110 - 2h after dinner: n/c >> 117-170 - bedtime: n/c - nighttime: n/c No lows. Lowest sugar was 160 >> 101 >> 73; ? hypoglycemia awareness. Highest sugar was 240s lately >> 141 >> 170s (stress) >> 170.  Glucometer: One Touch  Pt's meals are: - Breakfast: Eggs, salmon, sausage, but  mostly protein shake - Lunch: salad or skips - Dinner: salad or sandwich, burger, seafood - Snacks: 0-2, unsalted peanuts, pistachios  - No CKD, last BUN/creatinine:  Lab Results  Component Value Date   BUN 13 11/27/2015   BUN 9 11/18/2015   CREATININE 0.53 11/27/2015   CREATININE 0.62 11/18/2015   - last set of lipids: Lab Results  Component Value Date   CHOL 174 11/27/2015   HDL 67 11/27/2015   LDLCALC 96 11/27/2015   TRIG 54 11/27/2015   CHOLHDL 2.6 11/27/2015  On Lipitor. - last eye exam was in 02/2016 >> No DR. - she debnies numbness and tingling in her feet.  ROS: Constitutional: + weight gain/+ weight loss, + fatigue, + hot flushes, no subjective hypothermia Eyes: no blurry vision, no xerophthalmia ENT: no sore throat, no nodules palpated in throat, no dysphagia, no odynophagia, no hoarseness Cardiovascular: no CP/no SOB/no palpitations/no leg swelling Respiratory: no cough/no SOB/no wheezing Gastrointestinal: no N/no V/no D/no C/no acid reflux Musculoskeletal: no muscle aches/no joint aches Skin: no rashes, no hair loss Neurological: no tremors/no numbness/no tingling/no dizziness  I reviewed pt's medications, allergies, PMH, social hx, family hx, and changes were documented in the history of present illness. Otherwise, unchanged from my initial visit note.   Past Medical History:  Diagnosis Date  . Allergy   . Anemia   .  Arthritis    Past Surgical History:  Procedure Laterality Date  . KNEE SURGERY     Social History   Social History  . Marital status: Single    Spouse name: N/A  . Number of children: 0   Occupational History  . Machine Garment/textile technologist    Social History Main Topics  . Smoking status: Never Smoker  . Smokeless tobacco: Not on file  . Alcohol use Yes - wine and mixed drinks rarely   . Drug use: No   Social History Narrative   Raised between [place] and [place].   Current Outpatient Prescriptions on File Prior to  Visit  Medication Sig Dispense Refill  . atorvastatin (LIPITOR) 20 MG tablet Take 1 tablet (20 mg total) by mouth daily. 90 tablet 3  . blood glucose meter kit and supplies KIT Dispense based on patient and insurance preference. Use up to four times daily as directed. (FOR ICD-9 250.00, 250.01). 1 each 0  . JANUVIA 100 MG tablet TAKE 1 TABLET (100 MG TOTAL) BY MOUTH DAILY. 30 tablet 5  . metFORMIN (GLUCOPHAGE-XR) 500 MG 24 hr tablet TAKE 2 TABLETS BY MOUTH TWICE A DAY 360 tablet 3   No current facility-administered medications on file prior to visit.    Allergies  Allergen Reactions  . Aspartame And Phenylalanine Other (See Comments)    Artificial sweetners - headache  . Bee Venom Swelling  . Latex Hives and Itching  . Phenylalanine Other (See Comments)    Artificial sweetners - headache   Family History  Problem Relation Age of Onset  . Cancer Mother   . Heart disease Mother   . Hyperlipidemia Mother   . Hypertension Mother    PE: BP 130/82   Pulse 79   Ht _0  (1.727 m)   Wt 253 lb 6.4 oz (114.9 kg)   LMP 11/18/2016 (Exact Date)   SpO2 98%   BMI 38.53 kg/m  Wt Readings from Last 3 Encounters:  02/27/17 253 lb 6.4 oz (114.9 kg)  11/21/16 250 lb (113.4 kg)  03/05/16 257 lb (116.6 kg)   Constitutional: obese, in NAD Eyes: PERRLA, EOMI, no exophthalmos ENT: moist mucous membranes, no thyromegaly, no cervical lymphadenopathy Cardiovascular: RRR, No MRG Respiratory: CTA B Gastrointestinal: abdomen soft, NT, ND, BS+ Musculoskeletal: no deformities, strength intact in all 4 Skin: moist, warm, no rashes Neurological: no tremor with outstretched hands, DTR normal in all 4  ASSESSMENT: 1. DM2, non-insulin-dependent, uncontrolled, without long term complications but with hyperglycemia  2. Obesity class 2 BMI Classification:  < 18.5 underweight   18.5-24.9 normal weight   25.0-29.9 overweight   30.0-34.9 class I obesity   35.0-39.9 class II obesity   ? 40.0  class III obesity   PLAN:  1. Patient with relatively new dg of DM, with improving control after she started to pay more attention to diet, started to exercise and also takes meds as prescribed. HbA1c is 7.6% (better!) today. - sugars are better with few hyperglycemic spikes - we discussed about the need to improve her diet an I suggested a plant-based diet >> given referrence - No changes needed in her regimen for now - I suggested to:  Patient Instructions  Please continue: - Metformin ER 1000 mg 2x a day - Januvia 100 mg in am  Please look into "The Engine 2 Diet" by Christa See.  Please return in 3 months with your sugar log.    - continue checking sugars at different times of the  day - check 1x a day, rotating checks - advised for yearly eye exams >> she is UTD - Return to clinic in 3 mo with sugar log  2. Obesity class 2 - reviewed her weight and BMI along with the pt >> she may qualify for GBP as her BMI is >35 and has diabetes, but we discussed about potential side effects including the risk of gaining the weight back and the benefits of finding a healthy diet to improve both her weight and her DM  Component     Latest Ref Rng & Units 02/27/2017  Glucose     65 - 99 mg/dL 106 (H)  BUN     7 - 25 mg/dL 13  Creatinine     0.50 - 1.05 mg/dL 0.59  GFR, Est Non African American     > OR = 60 mL/min/1.40m 105  GFR, Est African American     > OR = 60 mL/min/1.733m121  BUN/Creatinine Ratio     6 - 22 (calc) NOT APPLICABLE  Sodium     13160 146 mmol/L 139  Potassium     3.5 - 5.3 mmol/L 4.1  Chloride     98 - 110 mmol/L 103  CO2     20 - 32 mmol/L 25  Calcium     8.6 - 10.4 mg/dL 9.4  Total Protein     6.1 - 8.1 g/dL 7.0  Albumin MSPROF     3.6 - 5.1 g/dL 4.3  Globulin     1.9 - 3.7 g/dL (calc) 2.7  AG Ratio     1.0 - 2.5 (calc) 1.6  Total Bilirubin     0.2 - 1.2 mg/dL 0.6  Alkaline phosphatase (APISO)     33 - 130 U/L 49  AST     10 - 35 U/L 19  ALT      6 - 29 U/L 23  Cholesterol     0 - 200 mg/dL 115  Triglycerides     0.0 - 149.0 mg/dL 36.0  HDL Cholesterol     >39.00 mg/dL 55.30  VLDL     0.0 - 40.0 mg/dL 7.2  LDL (calc)     0 - 99 mg/dL 52  Total CHOL/HDL Ratio      2  NonHDL      59.48  Microalb, Ur     0.0 - 1.9 mg/dL 2.6 (H)  Creatinine,U     mg/dL 111.1  MICROALB/CREAT RATIO     0.0 - 30.0 mg/g 2.3  Hemoglobin A1C      7.6%    CrPhilemon KingdomMD PhD [facility]

## 2017-03-04 ENCOUNTER — Telehealth: Payer: Self-pay

## 2017-03-04 NOTE — Telephone Encounter (Signed)
Called patient and gave lab results. Patient had no questions or concerns.  

## 2017-03-04 NOTE — Telephone Encounter (Signed)
-----   Message from Carlus Pavlovristina Gherghe, MD sent at 02/28/2017 11:17 AM EDT ----- Raynelle FanningJulie, can you please call pt: labs are all good

## 2017-03-29 ENCOUNTER — Ambulatory Visit: Payer: Self-pay | Admitting: Orthopedic Surgery

## 2017-04-28 NOTE — H&P (Signed)
TOTAL KNEE ADMISSION H&P  Patient is being admitted for left total knee arthroplasty.  Subjective:  Chief Complaint:left knee pain.  HPI: Diana Burns, 56 y.o. female, has a history of pain and functional disability in the left knee due to arthritis and has failed non-surgical conservative treatments for greater than 12 weeks to includecorticosteriod injections, viscosupplementation injections, use of assistive devices, weight reduction as appropriate and activity modification.  Onset of symptoms was gradual, starting 3 years ago with gradually worsening course since that time. The patient noted prior procedures on the knee to include  arthroscopy on the left knee(s).  Patient currently rates pain in the left knee(s) at 8 out of 10 with activity. Patient has worsening of pain with activity and weight bearing, pain with passive range of motion and joint swelling.  Patient has evidence of subchondral sclerosis, periarticular osteophytes and joint space narrowing by imaging studies. There is no active infection.  Patient Active Problem List   Diagnosis Date Noted  . Class 2 obesity with serious comorbidity and body mass index (BMI) of 38.0 to 38.9 in adult 02/27/2017  . Type 2 diabetes mellitus with hyperglycemia, without long-term current use of insulin ([place]) 01/16/2016   Past Medical History:  Diagnosis Date  . Allergy   . Anemia   . Arthritis     Past Surgical History:  Procedure Laterality Date  . KNEE SURGERY      No current facility-administered medications for this encounter.    Current Outpatient Medications  Medication Sig Dispense Refill Last Dose  . atorvastatin (LIPITOR) 20 MG tablet Take 1 tablet (20 mg total) by mouth daily. 90 tablet 3 Taking  . blood glucose meter kit and supplies KIT Dispense based on patient and insurance preference. Use up to four times daily as directed. (FOR ICD-9 250.00, 250.01). 1 each 0 Taking  . JANUVIA 100 MG tablet TAKE 1 TABLET (100 MG TOTAL)  BY MOUTH DAILY. 30 tablet 5 Taking  . metFORMIN (GLUCOPHAGE-XR) 500 MG 24 hr tablet TAKE 2 TABLETS BY MOUTH TWICE A DAY 360 tablet 3 Taking   Allergies  Allergen Reactions  . Aspartame And Phenylalanine Other (See Comments)    Artificial sweetners - headache  . Bee Venom Swelling  . Latex Hives and Itching  . Phenylalanine Other (See Comments)    Artificial sweetners - headache    Social History   Tobacco Use  . Smoking status: Never Smoker  . Smokeless tobacco: Never Used  Substance Use Topics  . Alcohol use: Yes    Family History  Problem Relation Age of Onset  . Cancer Mother   . Heart disease Mother   . Hyperlipidemia Mother   . Hypertension Mother      Review of Systems  Constitutional: Negative.   HENT: Negative.   Eyes: Negative.   Respiratory: Negative.   Cardiovascular: Negative.   Gastrointestinal: Negative.   Genitourinary: Negative.   Musculoskeletal: Positive for joint pain.  Skin: Negative.   Neurological: Negative.   Endo/Heme/Allergies: Negative.   Psychiatric/Behavioral: Negative.     Objective:  Physical Exam  Constitutional: She is oriented to person, place, and time. She appears well-developed.  HENT:  Head: Normocephalic.  Eyes: EOM are normal.  Neck: Normal range of motion.  Cardiovascular: Normal rate and intact distal pulses.  Respiratory: Effort normal.  GI: Soft.  Genitourinary:  Genitourinary Comments: deferred  Musculoskeletal:  Left knee pain. Limited rom. Knee is stable. LLE grossly n/v intact.  Neurological: She is alert and oriented  to person, place, and time.  Skin: Skin is warm and dry.  Psychiatric: Her behavior is normal.    Vital signs in last 24 hours: BP: ()/()  Arterial Line BP: ()/()   Labs:   Estimated body mass index is 38.53 kg/m as calculated from the following:   Height as of 02/27/17: '5\' 8"'  (1.727 m).   Weight as of 02/27/17: 114.9 kg (253 lb 6.4 oz).   Imaging Review Plain radiographs demonstrate  severe degenerative joint disease of the left knee(s). The overall alignment ismild varus. The bone quality appears to be good for age and reported activity level.  Assessment/Plan:  End stage arthritis, left knee   The patient history, physical examination, clinical judgment of the provider and imaging studies are consistent with end stage degenerative joint disease of the left knee(s) and total knee arthroplasty is deemed medically necessary. The treatment options including medical management, injection therapy arthroscopy and arthroplasty were discussed at length. The risks and benefits of total knee arthroplasty were presented and reviewed. The risks due to aseptic loosening, infection, stiffness, patella tracking problems, thromboembolic complications and other imponderables were discussed. The patient acknowledged the explanation, agreed to proceed with the plan and consent was signed. Patient is being admitted for inpatient treatment for surgery, pain control, PT, OT, prophylactic antibiotics, VTE prophylaxis, progressive ambulation and ADL's and discharge planning. The patient is planning to be discharged home with home health services.  Will use IV tranexamic acid. Contraindications and adverse affects of Tranexamic acid discussed in detail. Patient denies any of these at this time and understands the risks and benefits.

## 2017-05-13 NOTE — Progress Notes (Signed)
02-27-17 Surgical clearance from Dr. Elvera LennoxGherghe on chart.

## 2017-05-13 NOTE — Patient Instructions (Addendum)
Diana Burns  05/13/2017   Your procedure is scheduled on: 05-23-17   Report to [facility] Main  Entrance Take [place]  elevators to 3rd floor to  Short Stay Center at 10:45 AM.   Call this number if you have problems the morning of surgery [phone]    Remember: ONLY 1 PERSON MAY GO WITH YOU TO SHORT STAY TO GET  READY MORNING OF YOUR SURGERY.  Do not eat food or drink liquids :After Midnight. You may have a Clear Liquid Diet from Midnight until 7:15 AM. After 7:15 AM, nothing until after surgery.     CLEAR LIQUID DIET   Foods Allowed                                                                     Foods Excluded  Coffee and tea, regular and decaf                             liquids that you cannot  Plain Jell-O in any flavor                                             see through such as: Fruit ices (not with fruit pulp)                                     milk, soups, orange juice  Iced Popsicles                                    All solid food Carbonated beverages, regular and diet                                    Cranberry, grape and apple juices Sports drinks like Gatorade Lightly seasoned clear broth or consume(fat free) Sugar, honey syrup  Sample Menu Breakfast                                Lunch                                     Supper Cranberry juice                    Beef broth                            Chicken broth Jell-O                                     Grape juice  Apple juice Coffee or tea                        Jell-O                                      Popsicle                                                Coffee or tea                        Coffee or tea  _____________________________________________________________________     Take these medicines the morning of surgery with A SIP OF WATER: None DO NOT TAKE ANY DIABETIC MEDICATIONS DAY OF YOUR SURGERY                               You  may not have any metal on your body including hair pins and              piercings  Do not wear jewelry, make-up, lotions, powders or perfumes, deodorant             Do not wear nail polish.  Do not shave  48 hours prior to surgery.                Do not bring valuables to the hospital. [place] IS NOT             RESPONSIBLE   FOR VALUABLES.  Contacts, dentures or bridgework may not be worn into surgery.  Leave suitcase in the car. After surgery it may be brought to your room.              Please bring your mouth piece four your CPAP Machine             Please read over the following fact sheets you were given: _____________________________________________________________________             [facility] Before surgery, you can play an important role.  Because skin is not sterile, your skin needs to be as free of germs as possible.  You can reduce the number of germs on your skin by washing with CHG (chlorahexidine gluconate) soap before surgery.  CHG is an antiseptic cleaner which kills germs and bonds with the skin to continue killing germs even after washing. Please DO NOT use if you have an allergy to CHG or antibacterial soaps.  If your skin becomes reddened/irritated stop using the CHG and inform your nurse when you arrive at Short Stay. Do not shave (including legs and underarms) for at least 48 hours prior to the first CHG shower.  You may shave your face/neck. Please follow these instructions carefully:  1.  Shower with CHG Soap the night before surgery and the  morning of Surgery.  2.  If you choose to wash your hair, wash your hair first as usual with your  normal  shampoo.  3.  After you shampoo, rinse your hair and body thoroughly to remove the  shampoo.  4.  Use CHG as you would any other liquid soap.  You can apply chg directly  to the skin and wash                       Gently with a scrungie or clean washcloth.  5.  Apply  the CHG Soap to your body ONLY FROM THE NECK DOWN.   Do not use on face/ open                           Wound or open sores. Avoid contact with eyes, ears mouth and genitals (private parts).                       Wash face,  Genitals (private parts) with your normal soap.             6.  Wash thoroughly, paying special attention to the area where your surgery  will be performed.  7.  Thoroughly rinse your body with warm water from the neck down.  8.  DO NOT shower/wash with your normal soap after using and rinsing off  the CHG Soap.                9.  Pat yourself dry with a clean towel.            10.  Wear clean pajamas.            11.  Place clean sheets on your bed the night of your first shower and do not  sleep with pets. Day of Surgery : Do not apply any lotions/deodorants the morning of surgery.  Please wear clean clothes to the hospital/surgery center.  FAILURE TO FOLLOW THESE INSTRUCTIONS MAY RESULT IN THE CANCELLATION OF YOUR SURGERY PATIENT SIGNATURE_________________________________  NURSE SIGNATURE__________________________________  ________________________________________________________________________  How to Manage Your Diabetes Before and After Surgery  Why is it important to control my blood sugar before and after surgery? . Improving blood sugar levels before and after surgery helps healing and can limit problems. . A way of improving blood sugar control is eating a healthy diet by: o  Eating less sugar and carbohydrates o  Increasing activity/exercise o  Talking with your doctor about reaching your blood sugar goals . High blood sugars (greater than 180 mg/dL) can raise your risk of infections and slow your recovery, so you will need to focus on controlling your diabetes during the weeks before surgery. . Make sure that the doctor who takes care of your diabetes knows about your planned surgery including the date and location.  How do I manage my blood sugar before  surgery? . Check your blood sugar at least 4 times a day, starting 2 days before surgery, to make sure that the level is not too high or low. o Check your blood sugar the morning of your surgery when you wake up and every 2 hours until you get to the Short Stay unit. . If your blood sugar is less than 70 mg/dL, you will need to treat for low blood sugar: o Do not take insulin. o Treat a low blood sugar (less than 70 mg/dL) with  cup of clear juice (cranberry or apple), 4 glucose tablets, OR glucose gel. o Recheck blood sugar in 15 minutes after treatment (to make sure it is greater than 70 mg/dL). If your blood sugar is not greater than 70 mg/dL on recheck,  call [phone] for further instructions. . Report your blood sugar to the short stay nurse when you get to Short Stay.  . If you are admitted to the hospital after surgery: o Your blood sugar will be checked by the staff and you will probably be given insulin after surgery (instead of oral diabetes medicines) to make sure you have good blood sugar levels. o The goal for blood sugar control after surgery is 80-180 mg/dL.   WHAT DO I DO ABOUT MY DIABETES MEDICATION?  Marland Kitchen. Do not take oral diabetes medicines (pills) the morning of surgery.  . THE DAY BEFORE SURGERY, take your usual dose of Januvia and Metformin.        Patient Signature:  Date:   Nurse Signature:  Date:   Reviewed and Endorsed by [facility] Patient Education Committee, August 2015  Incentive Spirometer  An incentive spirometer is a tool that can help keep your lungs clear and active. This tool measures how well you are filling your lungs with each breath. Taking long deep breaths may help reverse or decrease the chance of developing breathing (pulmonary) problems (especially infection) following:  A long period of time when you are unable to move or be active. BEFORE THE PROCEDURE   If the spirometer includes an indicator to show your best effort, your nurse or  respiratory therapist will set it to a desired goal.  If possible, sit up straight or lean slightly forward. Try not to slouch.  Hold the incentive spirometer in an upright position. INSTRUCTIONS FOR USE  1. Sit on the edge of your bed if possible, or sit up as far as you can in bed or on a chair. 2. Hold the incentive spirometer in an upright position. 3. Breathe out normally. 4. Place the mouthpiece in your mouth and seal your lips tightly around it. 5. Breathe in slowly and as deeply as possible, raising the piston or the ball toward the top of the column. 6. Hold your breath for 3-5 seconds or for as long as possible. Allow the piston or ball to fall to the bottom of the column. 7. Remove the mouthpiece from your mouth and breathe out normally. 8. Rest for a few seconds and repeat Steps 1 through 7 at least 10 times every 1-2 hours when you are awake. Take your time and take a few normal breaths between deep breaths. 9. The spirometer may include an indicator to show your best effort. Use the indicator as a goal to work toward during each repetition. 10. After each set of 10 deep breaths, practice coughing to be sure your lungs are clear. If you have an incision (the cut made at the time of surgery), support your incision when coughing by placing a pillow or rolled up towels firmly against it. Once you are able to get out of bed, walk around indoors and cough well. You may stop using the incentive spirometer when instructed by your caregiver.  RISKS AND COMPLICATIONS  Take your time so you do not get dizzy or light-headed.  If you are in pain, you may need to take or ask for pain medication before doing incentive spirometry. It is harder to take a deep breath if you are having pain. AFTER USE  Rest and breathe slowly and easily.  It can be helpful to keep track of a log of your progress. Your caregiver can provide you with a simple table to help with this. If you are using the  spirometer at  home, follow these instructions: [place] IF:   You are having difficultly using the spirometer.  You have trouble using the spirometer as often as instructed.  Your pain medication is not giving enough relief while using the spirometer.  You develop fever of 100.5 F (38.1 C) or higher. SEEK IMMEDIATE MEDICAL CARE IF:   You cough up bloody sputum that had not been present before.  You develop fever of 102 F (38.9 C) or greater.  You develop worsening pain at or near the incision site. MAKE SURE YOU:   Understand these instructions.  Will watch your condition.  Will get help right away if you are not doing well or get worse. Document Released: 10/21/2006 Document Revised: 09/02/2011 Document Reviewed: 12/22/2006 ExitCare Patient Information 2014 ExitCare, [place].   ________________________________________________________________________  WHAT IS A BLOOD TRANSFUSION? Blood Transfusion Information  A transfusion is the replacement of blood or some of its parts. Blood is made up of multiple cells which provide different functions.  Red blood cells carry oxygen and are used for blood loss replacement.  White blood cells fight against infection.  Platelets control bleeding.  Plasma helps clot blood.  Other blood products are available for specialized needs, such as hemophilia or other clotting disorders. BEFORE THE TRANSFUSION  Who gives blood for transfusions?   Healthy volunteers who are fully evaluated to make sure their blood is safe. This is blood bank blood. Transfusion therapy is the safest it has ever been in the practice of medicine. Before blood is taken from a donor, a complete history is taken to make sure that person has no history of diseases nor engages in risky social behavior (examples are intravenous drug use or sexual activity with multiple partners). The donor's travel history is screened to minimize risk of transmitting  infections, such as malaria. The donated blood is tested for signs of infectious diseases, such as HIV and hepatitis. The blood is then tested to be sure it is compatible with you in order to minimize the chance of a transfusion reaction. If you or a relative donates blood, this is often done in anticipation of surgery and is not appropriate for emergency situations. It takes many days to process the donated blood. RISKS AND COMPLICATIONS Although transfusion therapy is very safe and saves many lives, the main dangers of transfusion include:   Getting an infectious disease.  Developing a transfusion reaction. This is an allergic reaction to something in the blood you were given. Every precaution is taken to prevent this. The decision to have a blood transfusion has been considered carefully by your caregiver before blood is given. Blood is not given unless the benefits outweigh the risks. AFTER THE TRANSFUSION  Right after receiving a blood transfusion, you will usually feel much better and more energetic. This is especially true if your red blood cells have gotten low (anemic). The transfusion raises the level of the red blood cells which carry oxygen, and this usually causes an energy increase.  The nurse administering the transfusion will monitor you carefully for complications. HOME CARE INSTRUCTIONS  No special instructions are needed after a transfusion. You may find your energy is better. Speak with your caregiver about any limitations on activity for underlying diseases you may have. SEEK MEDICAL CARE IF:   Your condition is not improving after your transfusion.  You develop redness or irritation at the intravenous (IV) site. SEEK IMMEDIATE MEDICAL CARE IF:  Any of the following symptoms occur over the next 12  hours:  Shaking chills.  You have a temperature by mouth above 102 F (38.9 C), not controlled by medicine.  Chest, back, or muscle pain.  People around you feel you are  not acting correctly or are confused.  Shortness of breath or difficulty breathing.  Dizziness and fainting.  You get a rash or develop hives.  You have a decrease in urine output.  Your urine turns a dark color or changes to pink, red, or brown. Any of the following symptoms occur over the next 10 days:  You have a temperature by mouth above 102 F (38.9 C), not controlled by medicine.  Shortness of breath.  Weakness after normal activity.  The white part of the eye turns yellow (jaundice).  You have a decrease in the amount of urine or are urinating less often.  Your urine turns a dark color or changes to pink, red, or brown. Document Released: 06/07/2000 Document Revised: 09/02/2011 Document Reviewed: 01/25/2008 [facility] Patient Information 2014 [place], [place].  _______________________________________________________________________

## 2017-05-20 ENCOUNTER — Encounter (HOSPITAL_COMMUNITY): Payer: Self-pay

## 2017-05-20 ENCOUNTER — Other Ambulatory Visit: Payer: Self-pay

## 2017-05-20 ENCOUNTER — Encounter (HOSPITAL_COMMUNITY)
Admission: RE | Admit: 2017-05-20 | Discharge: 2017-05-20 | Disposition: A | Discharge status: Home / Self Care | Payer: 59 | Source: Ambulatory Visit | Attending: Specialist | Admitting: Specialist

## 2017-05-20 ENCOUNTER — Encounter (INDEPENDENT_AMBULATORY_CARE_PROVIDER_SITE_OTHER): Payer: Self-pay

## 2017-05-20 DIAGNOSIS — Z01812 Encounter for preprocedural laboratory examination: Secondary | ICD-10-CM | POA: Insufficient documentation

## 2017-05-20 DIAGNOSIS — M1712 Unilateral primary osteoarthritis, left knee: Secondary | ICD-10-CM | POA: Insufficient documentation

## 2017-05-20 HISTORY — DX: Type 2 diabetes mellitus without complications: E11.9

## 2017-05-20 LAB — HEMOGLOBIN A1C
Hgb A1c MFr Bld: 6.9 % — ABNORMAL HIGH (ref 4.8–5.6)
Mean Plasma Glucose: 151.33 mg/dL

## 2017-05-20 LAB — URINALYSIS, ROUTINE W REFLEX MICROSCOPIC
BILIRUBIN URINE: NEGATIVE
Glucose, UA: NEGATIVE mg/dL
KETONES UR: NEGATIVE mg/dL
Nitrite: NEGATIVE
PH: 5 (ref 5.0–8.0)
Protein, ur: NEGATIVE mg/dL
Specific Gravity, Urine: 1.02 (ref 1.005–1.030)

## 2017-05-20 LAB — BASIC METABOLIC PANEL WITH GFR
Anion gap: 6 (ref 5–15)
BUN: 15 mg/dL (ref 6–20)
CO2: 28 mmol/L (ref 22–32)
Calcium: 9.3 mg/dL (ref 8.9–10.3)
Chloride: 107 mmol/L (ref 101–111)
Creatinine, Ser: 0.62 mg/dL (ref 0.44–1.00)
GFR calc Af Amer: >60 mL/min (ref >60)
GFR calc non Af Amer: >60 mL/min (ref >60)
Glucose, Bld: 110 mg/dL — ABNORMAL HIGH (ref 65–99)
Potassium: 4.2 mmol/L (ref 3.5–5.1)
Sodium: 141 mmol/L (ref 135–145)

## 2017-05-20 LAB — APTT: aPTT: 32 s (ref 24–36)

## 2017-05-20 LAB — SURGICAL PCR SCREEN
MRSA, PCR: NEGATIVE
Staphylococcus aureus: NEGATIVE

## 2017-05-20 LAB — CBC
HCT: 35.2 % — ABNORMAL LOW (ref 36.0–46.0)
Hemoglobin: 10.9 g/dL — ABNORMAL LOW (ref 12.0–15.0)
MCH: 21.5 pg — ABNORMAL LOW (ref 26.0–34.0)
MCHC: 31 g/dL (ref 30.0–36.0)
MCV: 69.6 fL — ABNORMAL LOW (ref 78.0–100.0)
Platelets: 265 K/uL (ref 150–400)
RBC: 5.06 MIL/uL (ref 3.87–5.11)
RDW: 15 % (ref 11.5–15.5)
WBC: 6.3 K/uL (ref 4.0–10.5)

## 2017-05-20 LAB — GLUCOSE, CAPILLARY: Glucose-Capillary: 108 mg/dL — ABNORMAL HIGH (ref 65–99)

## 2017-05-20 LAB — PROTIME-INR
INR: 1.03
Prothrombin Time: 13.4 s (ref 11.4–15.2)

## 2017-05-20 LAB — PREGNANCY, URINE: Preg Test, Ur: NEGATIVE

## 2017-05-20 LAB — ABO/RH: ABO/RH(D): O POS

## 2017-05-23 ENCOUNTER — Inpatient Hospital Stay (HOSPITAL_COMMUNITY): Payer: 59 | Admitting: Anesthesiology

## 2017-05-23 ENCOUNTER — Encounter (HOSPITAL_COMMUNITY)
Admission: RE | Disposition: A | Discharge status: Home Health | Payer: Self-pay | Source: Ambulatory Visit | Attending: Specialist

## 2017-05-23 ENCOUNTER — Inpatient Hospital Stay (HOSPITAL_COMMUNITY)
Admission: RE | Admit: 2017-05-23 | Discharge: 2017-05-27 | DRG: 470 | Disposition: A | Discharge status: Home Health | Payer: 59 | Source: Ambulatory Visit | Attending: Specialist | Admitting: Specialist

## 2017-05-23 ENCOUNTER — Encounter (HOSPITAL_COMMUNITY): Payer: Self-pay | Admitting: Anesthesiology

## 2017-05-23 ENCOUNTER — Other Ambulatory Visit: Payer: Self-pay

## 2017-05-23 DIAGNOSIS — M1712 Unilateral primary osteoarthritis, left knee: Secondary | ICD-10-CM | POA: Diagnosis not present

## 2017-05-23 DIAGNOSIS — Z7984 Long term (current) use of oral hypoglycemic drugs: Secondary | ICD-10-CM

## 2017-05-23 DIAGNOSIS — E669 Obesity, unspecified: Secondary | ICD-10-CM | POA: Diagnosis present

## 2017-05-23 DIAGNOSIS — E119 Type 2 diabetes mellitus without complications: Secondary | ICD-10-CM | POA: Diagnosis present

## 2017-05-23 DIAGNOSIS — Z6838 Body mass index (BMI) 38.0-38.9, adult: Secondary | ICD-10-CM | POA: Diagnosis not present

## 2017-05-23 DIAGNOSIS — Z96659 Presence of unspecified artificial knee joint: Secondary | ICD-10-CM

## 2017-05-23 DIAGNOSIS — M25562 Pain in left knee: Secondary | ICD-10-CM | POA: Diagnosis not present

## 2017-05-23 DIAGNOSIS — Z79899 Other long term (current) drug therapy: Secondary | ICD-10-CM

## 2017-05-23 DIAGNOSIS — D649 Anemia, unspecified: Secondary | ICD-10-CM | POA: Diagnosis not present

## 2017-05-23 DIAGNOSIS — M79609 Pain in unspecified limb: Secondary | ICD-10-CM | POA: Diagnosis not present

## 2017-05-23 HISTORY — PX: TOTAL KNEE ARTHROPLASTY: SHX125

## 2017-05-23 LAB — GLUCOSE, CAPILLARY
GLUCOSE-CAPILLARY: 107 mg/dL — AB (ref 65–99)
GLUCOSE-CAPILLARY: 134 mg/dL — AB (ref 65–99)
GLUCOSE-CAPILLARY: 157 mg/dL — AB (ref 65–99)
GLUCOSE-CAPILLARY: 165 mg/dL — AB (ref 65–99)

## 2017-05-23 LAB — TYPE AND SCREEN
ABO/RH(D): O POS
Antibody Screen: NEGATIVE

## 2017-05-23 SURGERY — ARTHROPLASTY, KNEE, TOTAL
Anesthesia: Monitor Anesthesia Care | Site: Knee | Laterality: Left

## 2017-05-23 MED ORDER — LINAGLIPTIN 5 MG PO TABS
5.0000 mg | ORAL_TABLET | Freq: Every day | ORAL | Status: DC
  Administered 2017-05-25 – 2017-05-27 (×3): 5 mg via ORAL
  Filled 2017-05-23 (×4): qty 1

## 2017-05-23 MED ORDER — CEFAZOLIN SODIUM-DEXTROSE 2-4 GM/100ML-% IV SOLN
2.0000 g | Freq: Four times a day (QID) | INTRAVENOUS | Status: AC
  Administered 2017-05-23 – 2017-05-24 (×2): 2 g via INTRAVENOUS
  Filled 2017-05-23 (×2): qty 100

## 2017-05-23 MED ORDER — BUPIVACAINE IN DEXTROSE 0.75-8.25 % IT SOLN
INTRATHECAL | Status: DC | PRN
  Administered 2017-05-23: 2 mL via INTRATHECAL

## 2017-05-23 MED ORDER — OXYCODONE HCL 5 MG PO TABS: 5.0000 mg | ORAL_TABLET | Freq: Once | ORAL | Status: DC | PRN

## 2017-05-23 MED ORDER — HYDROMORPHONE HCL 1 MG/ML IJ SOLN
INTRAMUSCULAR | Status: DC | PRN
  Administered 2017-05-23 (×2): 1 mg via INTRAVENOUS

## 2017-05-23 MED ORDER — DIPHENHYDRAMINE HCL 12.5 MG/5ML PO ELIX: 12.5000 mg | ORAL_SOLUTION | ORAL | Status: DC | PRN

## 2017-05-23 MED ORDER — DEXAMETHASONE SODIUM PHOSPHATE 10 MG/ML IJ SOLN
INTRAMUSCULAR | Status: DC | PRN
  Administered 2017-05-23: 10 mg via INTRAVENOUS

## 2017-05-23 MED ORDER — LIDOCAINE 2% (20 MG/ML) 5 ML SYRINGE
INTRAMUSCULAR | Status: DC | PRN
  Administered 2017-05-23: 50 mg via INTRAVENOUS

## 2017-05-23 MED ORDER — METHOCARBAMOL 500 MG PO TABS
500.0000 mg | ORAL_TABLET | Freq: Four times a day (QID) | ORAL | Status: DC | PRN
  Administered 2017-05-24 – 2017-05-27 (×10): 500 mg via ORAL
  Filled 2017-05-23 (×11): qty 1

## 2017-05-23 MED ORDER — PROPOFOL 10 MG/ML IV BOLUS
INTRAVENOUS | Status: AC
  Filled 2017-05-23: qty 20

## 2017-05-23 MED ORDER — SODIUM CHLORIDE 0.9 % IR SOLN
Status: DC | PRN
  Administered 2017-05-23: 1000 mL

## 2017-05-23 MED ORDER — MENTHOL 3 MG MT LOZG: 1.0000 | LOZENGE | OROMUCOSAL | Status: DC | PRN

## 2017-05-23 MED ORDER — PHENYLEPHRINE 40 MCG/ML (10ML) SYRINGE FOR IV PUSH (FOR BLOOD PRESSURE SUPPORT)
PREFILLED_SYRINGE | INTRAVENOUS | Status: AC
  Filled 2017-05-23: qty 10

## 2017-05-23 MED ORDER — BUPIVACAINE-EPINEPHRINE 0.25% -1:200000 IJ SOLN
INTRAMUSCULAR | Status: DC | PRN
  Administered 2017-05-23: 30 mL

## 2017-05-23 MED ORDER — SODIUM CHLORIDE 0.9 % IJ SOLN
INTRAMUSCULAR | Status: DC | PRN
  Administered 2017-05-23: 29 mL

## 2017-05-23 MED ORDER — ENOXAPARIN SODIUM 30 MG/0.3ML ~~LOC~~ SOLN
30.0000 mg | Freq: Two times a day (BID) | SUBCUTANEOUS | Status: DC
  Administered 2017-05-24 – 2017-05-27 (×7): 30 mg via SUBCUTANEOUS
  Filled 2017-05-23 (×7): qty 0.3

## 2017-05-23 MED ORDER — METOCLOPRAMIDE HCL 5 MG/ML IJ SOLN: 5.0000 mg | Freq: Three times a day (TID) | INTRAMUSCULAR | Status: DC | PRN

## 2017-05-23 MED ORDER — FENTANYL CITRATE (PF) 100 MCG/2ML IJ SOLN
INTRAMUSCULAR | Status: AC
  Administered 2017-05-23: 50 ug via INTRAVENOUS
  Filled 2017-05-23: qty 2

## 2017-05-23 MED ORDER — FERROUS SULFATE 325 (65 FE) MG PO TABS
325.0000 mg | ORAL_TABLET | Freq: Three times a day (TID) | ORAL | Status: DC
  Administered 2017-05-24 – 2017-05-27 (×8): 325 mg via ORAL
  Filled 2017-05-23 (×8): qty 1

## 2017-05-23 MED ORDER — MIDAZOLAM HCL 2 MG/2ML IJ SOLN
INTRAMUSCULAR | Status: AC
  Administered 2017-05-23: 2 mg via INTRAVENOUS
  Filled 2017-05-23: qty 2

## 2017-05-23 MED ORDER — METOPROLOL TARTRATE 5 MG/5ML IV SOLN
INTRAVENOUS | Status: AC
  Filled 2017-05-23: qty 5

## 2017-05-23 MED ORDER — DOCUSATE SODIUM 100 MG PO CAPS
100.0000 mg | ORAL_CAPSULE | Freq: Two times a day (BID) | ORAL | Status: DC
  Administered 2017-05-24 – 2017-05-27 (×7): 100 mg via ORAL
  Filled 2017-05-23 (×8): qty 1

## 2017-05-23 MED ORDER — BUPIVACAINE HCL (PF) 0.25 % IJ SOLN
INTRAMUSCULAR | Status: AC
  Filled 2017-05-23: qty 30

## 2017-05-23 MED ORDER — LACTATED RINGERS IV SOLN
INTRAVENOUS | Status: DC
  Administered 2017-05-23 (×4): via INTRAVENOUS

## 2017-05-23 MED ORDER — KETOROLAC TROMETHAMINE 30 MG/ML IJ SOLN
INTRAMUSCULAR | Status: DC | PRN
  Administered 2017-05-23: 30 mg

## 2017-05-23 MED ORDER — OXYCODONE HCL 5 MG PO TABS
5.0000 mg | ORAL_TABLET | ORAL | Status: DC | PRN
  Administered 2017-05-23 – 2017-05-24 (×6): 5 mg via ORAL
  Filled 2017-05-23 (×6): qty 1

## 2017-05-23 MED ORDER — POLYETHYLENE GLYCOL 3350 17 G PO PACK: 17.0000 g | PACK | Freq: Every day | ORAL | Status: DC | PRN

## 2017-05-23 MED ORDER — ONDANSETRON HCL 4 MG/2ML IJ SOLN
INTRAMUSCULAR | Status: AC
  Filled 2017-05-23: qty 2

## 2017-05-23 MED ORDER — FENTANYL CITRATE (PF) 100 MCG/2ML IJ SOLN
25.0000 ug | INTRAMUSCULAR | Status: DC | PRN
  Administered 2017-05-23 (×2): 50 ug via INTRAVENOUS

## 2017-05-23 MED ORDER — ALUM & MAG HYDROXIDE-SIMETH 200-200-20 MG/5ML PO SUSP: 30.0000 mL | ORAL | Status: DC | PRN

## 2017-05-23 MED ORDER — PHENYLEPHRINE 40 MCG/ML (10ML) SYRINGE FOR IV PUSH (FOR BLOOD PRESSURE SUPPORT)
PREFILLED_SYRINGE | INTRAVENOUS | Status: DC | PRN
  Administered 2017-05-23 (×3): 80 ug via INTRAVENOUS

## 2017-05-23 MED ORDER — PHENOL 1.4 % MT LIQD: 1.0000 | OROMUCOSAL | Status: DC | PRN

## 2017-05-23 MED ORDER — ROPIVACAINE HCL 7.5 MG/ML IJ SOLN
INTRAMUSCULAR | Status: DC | PRN
  Administered 2017-05-23: 20 mL via PERINEURAL

## 2017-05-23 MED ORDER — FENTANYL CITRATE (PF) 100 MCG/2ML IJ SOLN
INTRAMUSCULAR | Status: DC | PRN
  Administered 2017-05-23: 25 ug via INTRAVENOUS
  Administered 2017-05-23: 50 ug via INTRAVENOUS
  Administered 2017-05-23: 25 ug via INTRAVENOUS

## 2017-05-23 MED ORDER — ADULT MULTIVITAMIN W/MINERALS CH
1.0000 | ORAL_TABLET | Freq: Every day | ORAL | Status: DC
  Administered 2017-05-24 – 2017-05-27 (×4): 1 via ORAL
  Filled 2017-05-23 (×4): qty 1

## 2017-05-23 MED ORDER — ONDANSETRON HCL 4 MG/2ML IJ SOLN
INTRAMUSCULAR | Status: DC | PRN
  Administered 2017-05-23: 4 mg via INTRAVENOUS

## 2017-05-23 MED ORDER — OXYCODONE HCL 5 MG PO TABS: 5.0000 mg | ORAL_TABLET | ORAL | 0 refills | Status: AC | PRN

## 2017-05-23 MED ORDER — ATORVASTATIN CALCIUM 20 MG PO TABS
20.0000 mg | ORAL_TABLET | Freq: Every day | ORAL | Status: DC
  Administered 2017-05-24 – 2017-05-26 (×3): 20 mg via ORAL
  Filled 2017-05-23 (×4): qty 1

## 2017-05-23 MED ORDER — METOCLOPRAMIDE HCL 5 MG PO TABS: 5.0000 mg | ORAL_TABLET | Freq: Three times a day (TID) | ORAL | Status: DC | PRN

## 2017-05-23 MED ORDER — SODIUM CHLORIDE 0.9 % IJ SOLN
INTRAMUSCULAR | Status: AC
  Filled 2017-05-23: qty 50

## 2017-05-23 MED ORDER — SODIUM CHLORIDE 0.9 % IV SOLN
INTRAVENOUS | Status: DC
  Administered 2017-05-23 – 2017-05-24 (×2): via INTRAVENOUS

## 2017-05-23 MED ORDER — DEXAMETHASONE SODIUM PHOSPHATE 10 MG/ML IJ SOLN
INTRAMUSCULAR | Status: AC
  Filled 2017-05-23: qty 1

## 2017-05-23 MED ORDER — MIDAZOLAM HCL 5 MG/5ML IJ SOLN
INTRAMUSCULAR | Status: DC | PRN
  Administered 2017-05-23 (×2): 1 mg via INTRAVENOUS

## 2017-05-23 MED ORDER — FENTANYL CITRATE (PF) 100 MCG/2ML IJ SOLN
100.0000 ug | Freq: Once | INTRAMUSCULAR | Status: AC
  Administered 2017-05-23 (×2): 50 ug via INTRAVENOUS

## 2017-05-23 MED ORDER — BISACODYL 5 MG PO TBEC: 5.0000 mg | DELAYED_RELEASE_TABLET | Freq: Every day | ORAL | Status: DC | PRN

## 2017-05-23 MED ORDER — MIDAZOLAM HCL 2 MG/2ML IJ SOLN
2.0000 mg | Freq: Once | INTRAMUSCULAR | Status: AC
  Administered 2017-05-23: 2 mg via INTRAVENOUS

## 2017-05-23 MED ORDER — ACETAMINOPHEN 650 MG RE SUPP: 650.0000 mg | RECTAL | Status: DC | PRN

## 2017-05-23 MED ORDER — CHLORHEXIDINE GLUCONATE 4 % EX LIQD: 60.0000 mL | Freq: Once | CUTANEOUS | Status: DC

## 2017-05-23 MED ORDER — MIDAZOLAM HCL 2 MG/2ML IJ SOLN
INTRAMUSCULAR | Status: AC
Start: 2017-05-23 — End: 2017-05-23
  Filled 2017-05-23: qty 2

## 2017-05-23 MED ORDER — STERILE WATER FOR IRRIGATION IR SOLN
Status: DC | PRN
  Administered 2017-05-23: 2000 mL

## 2017-05-23 MED ORDER — FLEET ENEMA 7-19 GM/118ML RE ENEM: 1.0000 | ENEMA | Freq: Once | RECTAL | Status: DC | PRN

## 2017-05-23 MED ORDER — METHOCARBAMOL 1000 MG/10ML IJ SOLN
500.0000 mg | Freq: Four times a day (QID) | INTRAVENOUS | Status: DC | PRN
  Administered 2017-05-23: 500 mg via INTRAVENOUS
  Filled 2017-05-23: qty 5

## 2017-05-23 MED ORDER — SODIUM CHLORIDE 0.9 % IV SOLN
1000.0000 mg | INTRAVENOUS | Status: AC
  Administered 2017-05-23: 1000 mg via INTRAVENOUS
  Filled 2017-05-23: qty 1100

## 2017-05-23 MED ORDER — HYDROMORPHONE HCL 1 MG/ML IJ SOLN
1.0000 mg | INTRAMUSCULAR | Status: DC | PRN
  Administered 2017-05-24 (×2): 1 mg via INTRAVENOUS
  Filled 2017-05-23 (×2): qty 1

## 2017-05-23 MED ORDER — ACETAMINOPHEN 325 MG PO TABS
650.0000 mg | ORAL_TABLET | ORAL | Status: DC | PRN
  Administered 2017-05-24 – 2017-05-25 (×4): 650 mg via ORAL
  Filled 2017-05-23 (×4): qty 2

## 2017-05-23 MED ORDER — ONDANSETRON HCL 4 MG PO TABS: 4.0000 mg | ORAL_TABLET | Freq: Four times a day (QID) | ORAL | Status: DC | PRN

## 2017-05-23 MED ORDER — OXYCODONE HCL 5 MG/5ML PO SOLN
5.0000 mg | Freq: Once | ORAL | Status: DC | PRN
  Filled 2017-05-23: qty 5

## 2017-05-23 MED ORDER — INSULIN ASPART 100 UNIT/ML ~~LOC~~ SOLN
0.0000 [IU] | Freq: Three times a day (TID) | SUBCUTANEOUS | Status: DC
  Administered 2017-05-23 – 2017-05-24 (×3): 3 [IU] via SUBCUTANEOUS
  Administered 2017-05-24: 2 [IU] via SUBCUTANEOUS
  Administered 2017-05-25: 3 [IU] via SUBCUTANEOUS
  Administered 2017-05-25: 2 [IU] via SUBCUTANEOUS
  Administered 2017-05-25: 3 [IU] via SUBCUTANEOUS
  Administered 2017-05-26 – 2017-05-27 (×3): 2 [IU] via SUBCUTANEOUS
  Administered 2017-05-27: 3 [IU] via SUBCUTANEOUS

## 2017-05-23 MED ORDER — BACLOFEN 10 MG PO TABS: 10.0000 mg | ORAL_TABLET | Freq: Three times a day (TID) | ORAL | 1 refills | Status: AC | PRN

## 2017-05-23 MED ORDER — OXYCODONE HCL 5 MG PO TABS
10.0000 mg | ORAL_TABLET | ORAL | Status: DC | PRN
  Administered 2017-05-25 – 2017-05-27 (×15): 10 mg via ORAL
  Filled 2017-05-23 (×15): qty 2

## 2017-05-23 MED ORDER — ASPIRIN EC 325 MG PO TBEC
325.0000 mg | DELAYED_RELEASE_TABLET | Freq: Two times a day (BID) | ORAL | 0 refills | Status: AC
Start: 2017-05-23 — End: 2018-05-23

## 2017-05-23 MED ORDER — CEFAZOLIN SODIUM-DEXTROSE 2-4 GM/100ML-% IV SOLN
2.0000 g | INTRAVENOUS | Status: AC
  Administered 2017-05-23: 2 g via INTRAVENOUS
  Filled 2017-05-23: qty 100

## 2017-05-23 MED ORDER — KETOROLAC TROMETHAMINE 30 MG/ML IJ SOLN
INTRAMUSCULAR | Status: AC
  Filled 2017-05-23: qty 1

## 2017-05-23 MED ORDER — METFORMIN HCL ER 500 MG PO TB24
1000.0000 mg | ORAL_TABLET | Freq: Two times a day (BID) | ORAL | Status: DC
  Administered 2017-05-24 – 2017-05-27 (×7): 1000 mg via ORAL
  Filled 2017-05-23 (×7): qty 2

## 2017-05-23 MED ORDER — PROPOFOL 10 MG/ML IV BOLUS
INTRAVENOUS | Status: DC | PRN
  Administered 2017-05-23: 30 mg via INTRAVENOUS
  Administered 2017-05-23: 10 mg via INTRAVENOUS

## 2017-05-23 MED ORDER — PROPOFOL 500 MG/50ML IV EMUL
INTRAVENOUS | Status: DC | PRN
Start: 2017-05-23 — End: 2017-05-23
  Administered 2017-05-23: 65 ug/kg/min via INTRAVENOUS

## 2017-05-23 MED ORDER — PROPOFOL 10 MG/ML IV BOLUS
INTRAVENOUS | Status: AC
  Filled 2017-05-23: qty 60

## 2017-05-23 MED ORDER — ONDANSETRON HCL 4 MG/2ML IJ SOLN: 4.0000 mg | Freq: Four times a day (QID) | INTRAMUSCULAR | Status: DC | PRN

## 2017-05-23 MED ORDER — FENTANYL CITRATE (PF) 100 MCG/2ML IJ SOLN
INTRAMUSCULAR | Status: AC
  Filled 2017-05-23: qty 2

## 2017-05-23 MED ORDER — HYDROMORPHONE HCL 2 MG/ML IJ SOLN
INTRAMUSCULAR | Status: AC
  Filled 2017-05-23: qty 1

## 2017-05-23 SURGICAL SUPPLY — 61 items
BAG DECANTER FOR FLEXI CONT (MISCELLANEOUS) IMPLANT
BAG ZIPLOCK 12X15 (MISCELLANEOUS) ×4 IMPLANT
BANDAGE ACE 4X5 VEL STRL LF (GAUZE/BANDAGES/DRESSINGS) ×2 IMPLANT
BANDAGE ACE 6X5 VEL STRL LF (GAUZE/BANDAGES/DRESSINGS) ×2 IMPLANT
BLADE SAG 18X100X1.27 (BLADE) ×2 IMPLANT
BLADE SAW SGTL 13.0X1.19X90.0M (BLADE) ×2 IMPLANT
BONE CEMENT GENTAMICIN (Cement) ×4 IMPLANT
BOWL SMART MIX CTS (DISPOSABLE) ×2 IMPLANT
CAP KNEE TOTAL 3 SIGMA ×2 IMPLANT
CEMENT BONE GENTAMICIN 40 (Cement) ×2 IMPLANT
COVER SURGICAL LIGHT HANDLE (MISCELLANEOUS) ×2 IMPLANT
CUFF TOURN SGL QUICK 34 (TOURNIQUET CUFF) ×1
CUFF TRNQT CYL 34X4X40X1 (TOURNIQUET CUFF) ×1 IMPLANT
DECANTER SPIKE VIAL GLASS SM (MISCELLANEOUS) ×4 IMPLANT
DERMABOND ADVANCED (GAUZE/BANDAGES/DRESSINGS) ×1
DERMABOND ADVANCED .7 DNX12 (GAUZE/BANDAGES/DRESSINGS) ×1 IMPLANT
DRAPE U-SHAPE 47X51 STRL (DRAPES) ×2 IMPLANT
DRSG AQUACEL AG ADV 3.5X10 (GAUZE/BANDAGES/DRESSINGS) ×2 IMPLANT
DRSG TEGADERM 4X4.75 (GAUZE/BANDAGES/DRESSINGS) ×2 IMPLANT
DURAPREP 26ML APPLICATOR (WOUND CARE) ×4 IMPLANT
ELECT REM PT RETURN 15FT ADLT (MISCELLANEOUS) ×2 IMPLANT
EVACUATOR 1/8 PVC DRAIN (DRAIN) ×2 IMPLANT
GAUZE SPONGE 2X2 8PLY STRL LF (GAUZE/BANDAGES/DRESSINGS) ×1 IMPLANT
GLOVE BIOGEL PI IND STRL 7.0 (GLOVE) ×4 IMPLANT
GLOVE BIOGEL PI IND STRL 8 (GLOVE) ×2 IMPLANT
GLOVE BIOGEL PI INDICATOR 7.0 (GLOVE) ×4
GLOVE BIOGEL PI INDICATOR 8 (GLOVE) ×2
GLOVE ECLIPSE 8.0 STRL XLNG CF (GLOVE) ×4 IMPLANT
GLOVE SURG ORTHO 9.0 STRL STRW (GLOVE) ×2 IMPLANT
GLOVE SURG SS PI 7.5 STRL IVOR (GLOVE) ×2 IMPLANT
GOWN STRL REUS W/TWL XL LVL3 (GOWN DISPOSABLE) ×8 IMPLANT
HANDPIECE INTERPULSE COAX TIP (DISPOSABLE) ×2
IMMOBILIZER KNEE 20 (SOFTGOODS) ×2
IMMOBILIZER KNEE 20 THIGH 36 (SOFTGOODS) ×1 IMPLANT
NS IRRIG 1000ML POUR BTL (IV SOLUTION) ×2 IMPLANT
PACK TOTAL KNEE CUSTOM (KITS) ×2 IMPLANT
POSITIONER SURGICAL ARM (MISCELLANEOUS) ×2 IMPLANT
SET HNDPC FAN SPRY TIP SCT (DISPOSABLE) ×1 IMPLANT
SET PAD KNEE POSITIONER (MISCELLANEOUS) ×2 IMPLANT
SPONGE GAUZE 2X2 STER 10/PKG (GAUZE/BANDAGES/DRESSINGS) ×1
SPONGE LAP 18X18 X RAY DECT (DISPOSABLE) IMPLANT
SPONGE SURGIFOAM ABS GEL 100 (HEMOSTASIS) ×2 IMPLANT
STOCKINETTE 6  STRL (DRAPES) ×1
STOCKINETTE 6 STRL (DRAPES) ×1 IMPLANT
STRIP CLOSURE SKIN 1/2X4 (GAUZE/BANDAGES/DRESSINGS) ×2 IMPLANT
SUCTION FRAZIER HANDLE 12FR (TUBING) ×1
SUCTION TUBE FRAZIER 12FR DISP (TUBING) ×1 IMPLANT
SUT BONE WAX W31G (SUTURE) IMPLANT
SUT MNCRL AB 3-0 PS2 18 (SUTURE) ×2 IMPLANT
SUT VIC AB 1 CT1 27 (SUTURE) ×4
SUT VIC AB 1 CT1 27XBRD ANTBC (SUTURE) ×4 IMPLANT
SUT VIC AB 2-0 CT1 27 (SUTURE) ×2
SUT VIC AB 2-0 CT1 TAPERPNT 27 (SUTURE) ×2 IMPLANT
SUT VLOC 180 0 24IN GS25 (SUTURE) ×2 IMPLANT
SYR 50ML LL SCALE MARK (SYRINGE) ×2 IMPLANT
TAPE STRIPS DRAPE STRL (GAUZE/BANDAGES/DRESSINGS) ×2 IMPLANT
TRAY FOLEY BAG SILVER LF 16FR (CATHETERS) ×2 IMPLANT
TRAY FOLEY W/METER SILVER 16FR (SET/KITS/TRAYS/PACK) IMPLANT
WATER STERILE IRR 1000ML POUR (IV SOLUTION) ×4 IMPLANT
WRAP KNEE MAXI GEL POST OP (GAUZE/BANDAGES/DRESSINGS) ×2 IMPLANT
YANKAUER SUCT BULB TIP 10FT TU (MISCELLANEOUS) ×2 IMPLANT

## 2017-05-23 NOTE — Anesthesia Preprocedure Evaluation (Signed)
Anesthesia Evaluation  Patient identified by MRN, date of birth, ID band Patient awake    Reviewed: Allergy & Precautions, NPO status , Patient's Chart, lab work & pertinent test results  History of Anesthesia Complications Negative for: history of anesthetic complications  Airway Mallampati: III  TM Distance: >3 FB Neck ROM: Full    Dental  (+) Teeth Intact   Pulmonary neg pulmonary ROS,    breath sounds clear to auscultation       Cardiovascular negative cardio ROS   Rhythm:Regular     Neuro/Psych negative neurological ROS  negative psych ROS   GI/Hepatic negative GI ROS, Neg liver ROS,   Endo/Other  diabetes, Type 2, Oral Hypoglycemic AgentsMorbid obesity  Renal/GU negative Renal ROS     Musculoskeletal  (+) Arthritis ,   Abdominal   Peds  Hematology  (+) anemia ,   Anesthesia Other Findings   Reproductive/Obstetrics                             Anesthesia Physical Anesthesia Plan  ASA: II  Anesthesia Plan: MAC, Spinal and Regional   Post-op Pain Management:    Induction:   PONV Risk Score and Plan: 2 and Ondansetron  Airway Management Planned: Nasal Cannula  Additional Equipment: None  Intra-op Plan:   Post-operative Plan:   Informed Consent: I have reviewed the patients History and Physical, chart, labs and discussed the procedure including the risks, benefits and alternatives for the proposed anesthesia with the patient or authorized representative who has indicated his/her understanding and acceptance.   Dental advisory given  Plan Discussed with: CRNA and Surgeon  Anesthesia Plan Comments:         Anesthesia Quick Evaluation

## 2017-05-23 NOTE — Anesthesia Postprocedure Evaluation (Signed)
Anesthesia Post Note  Patient: Nevaya Veith  Procedure(s) Performed: LEFT TOTAL KNEE ARTHROPLASTY (Left Knee)     Patient location during evaluation: PACU Anesthesia Type: Regional, MAC and Spinal Level of consciousness: awake and alert Pain management: pain level controlled Vital Signs Assessment: post-procedure vital signs reviewed and stable Respiratory status: spontaneous breathing, nonlabored ventilation, respiratory function stable and patient connected to nasal cannula oxygen Cardiovascular status: stable and blood pressure returned to baseline Postop Assessment: no apparent nausea or vomiting and spinal receding Anesthetic complications: no    Last Vitals:  Vitals:   05/23/17 1710 05/23/17 1715  BP:  132/81  Pulse: 79 85  Resp:  15  Temp:  36.6 C  SpO2: 95% 91%    Last Pain:  Vitals:   05/23/17 1715  TempSrc:   PainSc: Asleep                 Gedeon Brandow

## 2017-05-23 NOTE — Interval H&P Note (Signed)
History and Physical Interval Note:  05/23/2017 1:43 PM  Diana Burns  has presented today for surgery, with the diagnosis of left knee osteoarthritis  The various methods of treatment have been discussed with the patient and family. After consideration of risks, benefits and other options for treatment, the patient has consented to  Procedure(s): LEFT TOTAL KNEE ARTHROPLASTY (Left) as a surgical intervention .  The patient's history has been reviewed, patient examined, no change in status, stable for surgery.  I have reviewed the patient's chart and labs.  Questions were answered to the patient's satisfaction.     Jadden Yim ANDREW

## 2017-05-23 NOTE — Op Note (Signed)
DATE OF SURGERY:  05/23/2017  TIME: 4:11 PM  PATIENT NAME:  Diana Burns    AGE: 56 y.o.   PRE-OPERATIVE DIAGNOSIS:  left knee osteoarthritis  POST-OPERATIVE DIAGNOSIS:  left knee osteoarthritis  PROCEDURE:  Procedure(s): LEFT TOTAL KNEE ARTHROPLASTY  SURGEON:  Nadalee Neiswender ANDREW  ASSISTANT:  Bryson Stilwell, PA-C, present and scrubbed throughout the case, critical for assistance with exposure, retraction, instrumentation, and closure.  OPERATIVE IMPLANTS: Depuy PFC Sigma Rotating Platform.  Femur size 4, Tibia size 3, Patella size 354 3-peg oval button, with a 10 mm polyethylene insert.   PREOPERATIVE INDICATIONS:   Diana Burns is a 56 y.o. year old female with end stage bone on bone arthritis of the knee who failed conservative treatment and elected for Total Knee Arthroplasty.   The risks, benefits, and alternatives were discussed at length including but not limited to the risks of infection, bleeding, nerve injury, stiffness, blood clots, the need for revision surgery, cardiopulmonary complications, among others, and they were willing to proceed.  OPERATIVE DESCRIPTION:  The patient was brought to the operative room and placed in a supine position.  Spinal anesthesia was administered.  IV antibiotics were given.  The lower extremity was prepped and draped in the usual sterile fashion.  Time out was performed.  The leg was elevated and exsanguinated and the tourniquet was inflated.  Anterior quadriceps tendon splitting approach was performed.  The patella was retracted and osteophytes were removed.  The anterior horn of the medial and lateral meniscus was removed and cruciate ligaments resected.   The distal femur was opened with the drill and the intramedullary distal femoral cutting jig was utilized, set at 5 degrees resecting 10 mm off the distal femur.  Care was taken to protect the collateral ligaments.  The distal femoral sizing jig was applied, taking care to  avoid notching.  Then the 4-in-1 cutting jig was applied and the anterior and posterior femur was cut, along with the chamfer cuts.    Then the extramedullary tibial cutting jig was utilized making the appropriate cut using the anterior tibial crest as a reference building in appropriate posterior slope.  Care was taken during the cut to protect the medial and collateral ligaments.  The proximal tibia was removed along with the posterior horns of the menisci.   The posterior medial femoral osteophytes and posterior lateral femoral osteophytes were removed.    The flexion gap was then measured and was symmetric with the extension gap, measured at 10.  I completed the distal femoral preparation using the appropriate jig to prepare the box.  The patella was then measured, and cut with the saw.    The proximal tibia sized and prepared accordingly with the reamer and the punch, and then all components were trialed with the trial insert.  The knee was found to have excellent balance and full motion.    The above named components were then cemented into place and all excess cement was removed.  The trial polyethylene component was in place during cementation, and then was exchanged for the real polyethylene component.    The knee was easily taken through a range of motion and the patella tracked well and the knee irrigated copiously and the parapatellar and subcutaneous tissue closed with vicryl, and monocryl with steri strips for the skin.  The arthrotomy was closed at 90 of flexion. The wounds were dressed with sterile gauze and the tourniquet released and the patient was awakened and returned to the PACU in stable  and satisfactory condition.  There were no complications.  Total tourniquet time was 110 minutes.

## 2017-05-23 NOTE — Anesthesia Procedure Notes (Signed)
Anesthesia Regional Block: Adductor canal block   Pre-Anesthetic Checklist: ,, timeout performed, Correct Patient, Correct Site, Correct Laterality, Correct Procedure, Correct Position, site marked, Risks and benefits discussed,  Surgical consent,  Pre-op evaluation,  At surgeon's request and post-op pain management  Laterality: Lower and Left  Prep: chloraprep       Needles:  Injection technique: Single-shot  Needle Type: Echogenic Stimulator Needle          Additional Needles:   Narrative:  Start time: 05/23/2017 1:19 PM End time: 05/23/2017 1:22 PM Injection made incrementally with aspirations every 5 mL.  Performed by: Personally  Anesthesiologist: Val EagleMoser, Ein Rijo, MD  Additional Notes: H+P and labs reviewed, risks and benefits discussed with patient, procedure tolerated well without complications

## 2017-05-23 NOTE — Anesthesia Procedure Notes (Signed)
Spinal  Patient location during procedure: OR End time: 05/23/2017 1:55 PM Staffing Resident/CRNA: Noralyn Pick D, CRNA Performed: anesthesiologist and resident/CRNA  Preanesthetic Checklist Completed: patient identified, site marked, surgical consent, pre-op evaluation, timeout performed, IV checked, risks and benefits discussed and monitors and equipment checked Spinal Block Patient position: sitting Prep: Betadine Patient monitoring: heart rate, continuous pulse ox and blood pressure Approach: midline Location: L3-4 Injection technique: single-shot Needle Needle type: Sprotte  Needle gauge: 24 G Needle length: 9 cm Assessment Sensory level: T6 Additional Notes Expiration date of kit checked and confirmed. Patient tolerated procedure well, without complications.

## 2017-05-23 NOTE — Progress Notes (Signed)
Assisted Dr. Maple HudsonMoser with left, ultrasound guided, knee, adductor canal block. Side rails up, monitors on throughout procedure. See vital signs in flow sheet. Tolerated Procedure well.

## 2017-05-23 NOTE — Transfer of Care (Signed)
Immediate Anesthesia Transfer of Care Note  Patient: Diana Burns  Procedure(s) Performed: LEFT TOTAL KNEE ARTHROPLASTY (Left Knee)  Patient Location: PACU  Anesthesia Type:Regional and Spinal  Level of Consciousness: awake, alert  and oriented  Airway & Oxygen Therapy: Patient Spontanous Breathing and Patient connected to face mask oxygen  Post-op Assessment: Report given to RN and Post -op Vital signs reviewed and stable  Post vital signs: Reviewed and stable  Last Vitals:  Vitals:   05/23/17 1340 05/23/17 1630  BP:    Pulse: 84   Resp: 18 (P) 16  Temp:  (P) 36.8 C  SpO2: 98% (P) 99%    Last Pain:  Vitals:   05/23/17 1630  TempSrc:   PainSc: (P) 4       Patients Stated Pain Goal: (P) 4 (05/23/17 1630)  Complications: No apparent anesthesia complications

## 2017-05-24 LAB — BASIC METABOLIC PANEL
Anion gap: 7 (ref 5–15)
BUN: 8 mg/dL (ref 6–20)
CALCIUM: 8.1 mg/dL — AB (ref 8.9–10.3)
CO2: 25 mmol/L (ref 22–32)
CREATININE: 0.58 mg/dL (ref 0.44–1.00)
Chloride: 102 mmol/L (ref 101–111)
GFR calc Af Amer: >60 mL/min (ref >60)
GLUCOSE: 223 mg/dL — AB (ref 65–99)
Potassium: 4 mmol/L (ref 3.5–5.1)
SODIUM: 134 mmol/L — AB (ref 135–145)

## 2017-05-24 LAB — GLUCOSE, CAPILLARY
GLUCOSE-CAPILLARY: 172 mg/dL — AB (ref 65–99)
GLUCOSE-CAPILLARY: 122 mg/dL — AB (ref 65–99)
Glucose-Capillary: 175 mg/dL — ABNORMAL HIGH (ref 65–99)
Glucose-Capillary: 182 mg/dL — ABNORMAL HIGH (ref 65–99)

## 2017-05-24 LAB — CBC
HCT: 29.8 % — ABNORMAL LOW (ref 36.0–46.0)
Hemoglobin: 9.2 g/dL — ABNORMAL LOW (ref 12.0–15.0)
MCH: 21.5 pg — AB (ref 26.0–34.0)
MCHC: 30.9 g/dL (ref 30.0–36.0)
MCV: 69.8 fL — AB (ref 78.0–100.0)
PLATELETS: 210 10*3/uL (ref 150–400)
RBC: 4.27 MIL/uL (ref 3.87–5.11)
RDW: 14.9 % (ref 11.5–15.5)
WBC: 8 10*3/uL (ref 4.0–10.5)

## 2017-05-24 NOTE — Care Management Note (Signed)
Case Management Note  Patient Details  Name: Diana Burns MRN: [id number] Date of Birth: 08-11-1960  Subjective/Objective:   Left TKA                 Action/Plan: Discharge Planning: NCM spoke to pt and she has RW and bedside commode at home. [facility]     Expected Discharge Date:  05/25/17               Expected Discharge Plan:  Home w Home Health Services  In-House Referral:  NA  Discharge planning Services  CM Consult  Post Acute Care Choice:  Home Health Choice offered to:  Patient  DME Arranged:  N/A DME Agency:  NA  HH Arranged:  PT HH Agency:  [facility]  Status of Service:  Completed, signed off  If discussed at [employer] Meetings, dates discussed:    Additional Comments:  Elliot CousinShavis, Devery Murgia [name], RN 05/24/2017, 11:08 AM

## 2017-05-24 NOTE — Evaluation (Signed)
Physical Therapy Evaluation Patient Details Name: Diana Burns MRN: [id number] DOB: [birth year] Today's Date: 05/24/2017   History of Present Illness  s/p L TKA  Clinical Impression  Pt is s/p TKA resulting in the deficits listed below (see PT Problem List).  Pt will benefit from skilled PT to increase their independence and safety with mobility to allow discharge to the venue listed below.      Follow Up Recommendations DC plan and follow up therapy as arranged by surgeon    Equipment Recommendations  None recommended by PT    Recommendations for Other Services       Precautions / Restrictions Precautions Precautions: Knee Required Braces or Orthoses: Knee Immobilizer - Left Knee Immobilizer - Left: Discontinue once straight leg raise with < 10 degree lag Restrictions Weight Bearing Restrictions: No Other Position/Activity Restrictions: WBAT      Mobility  Bed Mobility Overal bed mobility: Needs Assistance Bed Mobility: Supine to Sit     Supine to sit: Min assist     General bed mobility comments: assist with LLE, cues for safety  Transfers Overall transfer level: Needs assistance Equipment used: Rolling walker (2 wheeled) Transfers: Sit to/from UGI CorporationStand;Stand Pivot Transfers Sit to Stand: Min guard Stand pivot transfers: Min assist       General transfer comment: verbal cues for hand placement and LLE positioning  Ambulation/Gait Ambulation/Gait assistance: Min guard Ambulation Distance (Feet): 130 Feet Assistive device: Rolling walker (2 wheeled) Gait Pattern/deviations: Step-to pattern;Step-through pattern;Decreased weight shift to left     General Gait Details: cues to incr knee extension and foot flat on L in stance  Stairs            Wheelchair Mobility    Modified Rankin (Stroke Patients Only)       Balance                                             Pertinent Vitals/Pain Pain Assessment: 0-10 Pain Score: 5   Pain Location: L knee Pain Descriptors / Indicators: Guarding;Grimacing;Sore Pain Intervention(s): Monitored during session;Premedicated before session;Repositioned    Home Living Family/patient expects to be discharged to:: Private residence Living Arrangements: Alone Available Help at Discharge: Friend(s) Type of Home: House Home Access: Stairs to enter   Enter[employer] CorporationEntrance Stairs-Number of Steps: 3 Home Layout: One level Home Equipment: Environmental consultantWalker - 2 wheels;Bedside commode Additional Comments: pt borrowing DME    Prior Function Level of Independence: Independent         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     Higher education careers adviserHand Dominance        Extremity/Trunk Assessment   Upper Extremity Assessment Upper Extremity Assessment: Defer to OT evaluation    Lower Extremity Assessment Lower Extremity Assessment: LLE deficits/detail LLE Deficits / Details: hip flexion and knee extension 2+/5, AAROM knee flexion ~12* to 55*; ankle WFL       Communication   Communication: No difficulties  Cognition Arousal/Alertness: Awake/alert Behavior During Therapy: WFL for tasks assessed/performed Overall Cognitive Status: Within Functional Limits for tasks assessed                                        General Comments      Exercises Total Joint Exercises Ankle Circles/Pumps: AROM;Both;10 reps Quad Sets:  AROM;Both;10 reps   Assessment/Plan    PT Assessment Patient needs continued PT services  PT Problem List Decreased strength;Decreased activity tolerance;Decreased knowledge of use of DME;Decreased mobility;Pain;Decreased range of motion       PT Treatment Interventions Gait training;DME instruction;Therapeutic activities;Stair training;Functional mobility training;Therapeutic exercise;Patient/family education    PT Goals (Current goals can be found in the Care Plan section)  Acute Rehab PT Goals Patient Stated Goal: home soon, get back to work PT  Goal Formulation: With patient Time For Goal Achievement: 05/31/17 Potential to Achieve Goals: Good    Frequency 7X/week   Barriers to discharge        Co-evaluation PT/OT/SLP Co-Evaluation/Treatment: Yes(partial co-tx) Reason for Co-Treatment: For patient/therapist safety;To address functional/ADL transfers PT goals addressed during session: Mobility/safety with mobility;Proper use of DME OT goals addressed during session: ADL's and self-care       AM-PAC PT "6 Clicks" Daily Activity  Outcome Measure Difficulty turning over in bed (including adjusting bedclothes, sheets and blankets)?: Unable Difficulty moving from lying on back to sitting on the side of the bed? : Unable Difficulty sitting down on and standing up from a chair with arms (e.g., wheelchair, bedside commode, etc,.)?: A Little Help needed moving to and from a bed to chair (including a wheelchair)?: A Little Help needed walking in hospital room?: A Little Help needed climbing 3-5 steps with a railing? : A Lot 6 Click Score: 13    End of Session Equipment Utilized During Treatment: Gait belt Activity Tolerance: Patient tolerated treatment well Patient left: in chair;with call bell/phone within reach;with family/visitor present   PT Visit Diagnosis: Difficulty in walking, not elsewhere classified (R26.2)    Time: [id number] PT Time Calculation (min) (ACUTE ONLY): 24 min   Charges:   PT Evaluation $PT Eval Low Complexity: 1 Low     PT G Codes:          Diana Burns 05/24/2017, 12:07 PM

## 2017-05-24 NOTE — Progress Notes (Signed)
Occupational Therapy Evaluation Patient Details Name: Diana Burns MRN: [id number] DOB: Mar 03, 1961 Today's Date: 05/24/2017    History of Present Illness s/p L TKA   Clinical Impression   Patient presents to OT with decreased ADL independence and safety due to the deficits listed below. She will benefit from skilled OT to maximize function and to facilitate a safe discharge. OT will follow.    Follow Up Recommendations  DC plan and follow up therapy as arranged by surgeon;Supervision/Assistance - 24 hour    Equipment Recommendations  None recommended by OT(has a borrowed BSC)    Recommendations for Other Services       Precautions / Restrictions Precautions Precautions: Knee Required Braces or Orthoses: Knee Immobilizer - Left Knee Immobilizer - Left: Discontinue once straight leg raise with < 10 degree lag Restrictions Weight Bearing Restrictions: No Other Position/Activity Restrictions: WBAT      Mobility Bed Mobility           General bed mobility comments: NT -- OOB on BSC upon arrival  Transfers Overall transfer level: Needs assistance Equipment used: Rolling walker (2 wheeled) Transfers: Sit to/from UGI CorporationStand;Stand Pivot Transfers Sit to Stand: Min guard;Min assist;From elevated surface Stand pivot transfers: Min assist       General transfer comment: verbal cues for hand placement and LLE positioning    Balance                                           ADL either performed or assessed with clinical judgement   ADL Overall ADL's : Needs assistance/impaired Eating/Feeding: Independent;Sitting   Grooming: Wash/dry hands;Set up;Sitting           Upper Body Dressing : Set up;Sitting   Lower Body Dressing: Minimal assistance;Sit to/from stand   Toilet Transfer: Minimal assistance;Stand-pivot;BSC;RW   Toileting- Clothing Manipulation and Hygiene: Set up;Sitting/lateral lean       Functional mobility during ADLs: Minimal  assistance;Min guard;Cueing for sequencing;Rolling walker General ADL Comments: Patient needs cues to not overdo it.     Vision         Perception     Praxis      Pertinent Vitals/Pain Pain Assessment: 0-10 Pain Score: 5  Pain Location: L knee Pain Descriptors / Indicators: Guarding;Grimacing;Sore Pain Intervention(s): Limited activity within patient's tolerance;Monitored during session;Premedicated before session;Repositioned     Hand Dominance     Extremity/Trunk Assessment Upper Extremity Assessment Upper Extremity Assessment: Overall WFL for tasks assessed   Lower Extremity Assessment Lower Extremity Assessment: Defer to PT evaluation    Cervical / Trunk Assessment Cervical / Trunk Assessment: Normal   Communication Communication Communication: No difficulties   Cognition Arousal/Alertness: Awake/alert Behavior During Therapy: WFL for tasks assessed/performed Overall Cognitive Status: Within Functional Limits for tasks assessed                                     General Comments       Exercises    Shoulder Instructions      Home Living Family/patient expects to be discharged to:: Private residence Living Arrangements: Alone Available Help at Discharge: Friend(s) Type of Home: House Home Access: Stairs to enter Entergy CorporationEntrance Stairs-Number of Steps: 3   Home Layout: One level     Bathroom Shower/Tub: Chief Strategy OfficerTub/shower unit   Bathroom Toilet: Standard  Home Equipment: Walker - 2 wheels;Bedside commode   Additional Comments: pt borrowing DME      Prior Functioning/Environment Level of Independence: Independent        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        OT Problem List: Decreased strength;Decreased range of motion;Decreased knowledge of use of DME or AE;Pain      OT Treatment/Interventions: Self-care/ADL training;DME and/or AE instruction;Therapeutic activities;Patient/family education    OT  Goals(Current goals can be found in the care plan section) Acute Rehab OT Goals Patient Stated Goal: home soon, get back to work OT Goal Formulation: With patient Time For Goal Achievement: 06/07/17 Potential to Achieve Goals: Good ADL Goals Pt Will Perform Lower Body Bathing: with modified independence;sit to/from stand Pt Will Perform Lower Body Dressing: with modified independence;sit to/from stand Pt Will Transfer to Toilet: with modified independence;ambulating;bedside commode Pt Will Perform Toileting - Clothing Manipulation and hygiene: Independently;sitting/lateral leans Pt Will Perform Tub/Shower Transfer: with min assist;ambulating;rolling walker  OT Frequency: Min 2X/week   Barriers to D/C:            Co-evaluation PT/OT/SLP Co-Evaluation/Treatment: Yes Reason for Co-Treatment: For patient/therapist safety PT goals addressed during session: Mobility/safety with mobility OT goals addressed during session: ADL's and self-care      AM-PAC PT "6 Clicks" Daily Activity     Outcome Measure Help from another person eating meals?: None Help from another person taking care of personal grooming?: None Help from another person toileting, which includes using toliet, bedpan, or urinal?: A Little Help from another person bathing (including washing, rinsing, drying)?: A Little Help from another person to put on and taking off regular upper body clothing?: None Help from another person to put on and taking off regular lower body clothing?: A Little 6 Click Score: 21   End of Session Equipment Utilized During Treatment: Gait belt;Rolling walker Nurse Communication: Mobility status  Activity Tolerance: Patient tolerated treatment well Patient left: in chair;with call bell/phone within reach;with family/visitor present  OT Visit Diagnosis: Unsteadiness on feet (R26.81);Muscle weakness (generalized) (M62.81)                Time: [id number] OT Time Calculation (min): 16  min Charges:  OT General Charges $OT Visit: 1 Visit OT Evaluation $OT Eval Low Complexity: 1 Low G-Codes:       Damica Gravlin A Kura Bethards 05/24/2017, 12:34 PM

## 2017-05-24 NOTE — Progress Notes (Signed)
   05/24/17 1600  PT Visit Information  Last PT Received On 05/24/17 Pt very motivated but limited by pain this pm; will continue to follow  Assistance Needed +1  History of Present Illness s/p L TKA  Subjective Data  Patient Stated Goal home soon, get back to work  Precautions  Precautions Knee  Precaution Comments ind SLRs  Restrictions  Other Position/Activity Restrictions WBAT  Pain Assessment  Pain Assessment 0-10  Pain Score 9  Pain Location L knee  Pain Descriptors / Indicators Guarding;Grimacing;Sore;Operative site guarding;Spasm;Cramping;Constant  Pain Intervention(s) Monitored during session;Limited activity within patient's tolerance;Premedicated before session;Repositioned;Patient requesting pain meds-RN notified;RN gave pain meds during session;Relaxation;Ice applied  Cognition  Arousal/Alertness Awake/alert  Behavior During Therapy WFL for tasks assessed/performed  Overall Cognitive Status Within Functional Limits for tasks assessed  Bed Mobility  Bed Mobility Sit to Supine  Sit to supine Min assist  General bed mobility comments assist with LLE  Transfers  Overall transfer level Needs assistance  Equipment used Rolling walker (2 wheeled)  Transfers Sit to/from Stand  Sit to Stand Min assist;Min guard  Stand pivot transfers Min assist  General transfer comment verbal cues for hand placement and LLE positioning  Ambulation/Gait  General Gait Details (deferred amb d/t pain)  Total Joint Exercises  Ankle Circles/Pumps AROM;Both;10 reps  Quad Sets AROM;Both;10 reps  Heel Slides AAROM;10 reps;Left  Hip ABduction/ADduction AAROM;Left;10 reps  Straight Leg Raises AAROM;AROM;Strengthening;Left;10 reps  Goniometric ROM grossly 15* to 65*  PT - End of Session  Equipment Utilized During Treatment Gait belt  Activity Tolerance Patient limited by pain  Patient left in bed;with call bell/phone within reach;with bed alarm set;with family/visitor present  PT -  Assessment/Plan  PT Plan Current plan remains appropriate  PT Visit Diagnosis Difficulty in walking, not elsewhere classified (R26.2)  PT Frequency (ACUTE ONLY) 7X/week  Follow Up Recommendations DC plan and follow up therapy as arranged by surgeon  PT equipment None recommended by PT  AM-PAC PT "6 Clicks" Daily Activity Outcome Measure  Difficulty turning over in bed (including adjusting bedclothes, sheets and blankets)? 1  Difficulty moving from lying on back to sitting on the side of the bed?  1  Difficulty sitting down on and standing up from a chair with arms (e.g., wheelchair, bedside commode, etc,.)? 3  Help needed moving to and from a bed to chair (including a wheelchair)? 3  Help needed walking in hospital room? 3  Help needed climbing 3-5 steps with a railing?  2  6 Click Score 13  Mobility G Code  CK  PT Goal Progression  Progress towards PT goals Progressing toward goals  Acute Rehab PT Goals  PT Goal Formulation With patient  Time For Goal Achievement 05/31/17  Potential to Achieve Goals Good  PT Time Calculation  PT Start Time (ACUTE ONLY) 1415  PT Stop Time (ACUTE ONLY) 1450  PT Time Calculation (min) (ACUTE ONLY) 35 min  PT General Charges  $$ ACUTE PT VISIT 1 Visit  PT Treatments  $Therapeutic Exercise 8-22 mins  $Therapeutic Activity 8-22 mins

## 2017-05-24 NOTE — Progress Notes (Signed)
     Subjective: 1 Day Post-Op Procedure(s) (LRB): LEFT TOTAL KNEE ARTHROPLASTY (Left)   Patient reports pain as mild, pain controlled. No events throughout the night.  States she didn't work with PT yesterday, because she was "so sleepy after anesthesia."  Looking to be able to get up and work with PT.  Plan on d/c home when ready, possible tomorrow.  Objective:   VITALS:   Vitals:   05/24/17 0149 05/24/17 0621  BP: (!) 143/84 135/76  Pulse: 77 68  Resp: 16 17  Temp: 97.6 F (36.4 C) 98.1 F (36.7 C)  SpO2: 100% 100%    Dorsiflexion/Plantar flexion intact Incision: dressing C/D/I No cellulitis present Compartment soft  LABS Recent Labs    05/24/17 0518  HGB 9.2*  HCT 29.8*  WBC 8.0  PLT 210    Recent Labs    05/24/17 0518  NA 134*  K 4.0  BUN 8  CREATININE 0.58  GLUCOSE 223*     Assessment/Plan: 1 Day Post-Op Procedure(s) (LRB): LEFT TOTAL KNEE ARTHROPLASTY (Left) HV drain pulled Foley cath d/c'ed Advance diet Up with therapy D/C IV fluids Discharge home when ready, possibly tomorrow.     Anastasio AuerbachMatthew S. Gurshan Settlemire   PAC  05/24/2017, 8:57 AM

## 2017-05-24 NOTE — Plan of Care (Signed)
Plan of care reviewed with patient.

## 2017-05-25 LAB — CBC
HEMATOCRIT: 28.1 % — AB (ref 36.0–46.0)
Hemoglobin: 8.6 g/dL — ABNORMAL LOW (ref 12.0–15.0)
MCH: 21.1 pg — ABNORMAL LOW (ref 26.0–34.0)
MCHC: 30.6 g/dL (ref 30.0–36.0)
MCV: 69 fL — ABNORMAL LOW (ref 78.0–100.0)
PLATELETS: 209 10*3/uL (ref 150–400)
RBC: 4.07 MIL/uL (ref 3.87–5.11)
RDW: 14.6 % (ref 11.5–15.5)
WBC: 7.3 10*3/uL (ref 4.0–10.5)

## 2017-05-25 LAB — GLUCOSE, CAPILLARY
GLUCOSE-CAPILLARY: 175 mg/dL — AB (ref 65–99)
Glucose-Capillary: 175 mg/dL — ABNORMAL HIGH (ref 65–99)
Glucose-Capillary: 148 mg/dL — ABNORMAL HIGH (ref 65–99)
Glucose-Capillary: 128 mg/dL — ABNORMAL HIGH (ref 65–99)

## 2017-05-25 NOTE — Progress Notes (Signed)
Physical Therapy Treatment Patient Details Name: Diana Burns MRN: [id number] DOB: Sep 18, 1960 Today's Date: 05/25/2017    History of Present Illness s/p L TKA    PT Comments    Lots of pain, limiting mobility, Rn aware   Follow Up Recommendations  DC plan and follow up therapy as arranged by surgeon     Equipment Recommendations  None recommended by PT    Recommendations for Other Services       Precautions / Restrictions Precautions Precautions: Knee Precaution Comments: unable to do SLRs today d/t pain, KI  utilized Required Braces or Orthoses: Knee Immobilizer - Left Knee Immobilizer - Left: Discontinue once straight leg raise with < 10 degree lag Restrictions Weight Bearing Restrictions: No Other Position/Activity Restrictions: WBAT    Mobility  Bed Mobility Overal bed mobility: Needs Assistance Bed Mobility: Supine to Sit;Sit to Supine     Supine to sit: Min assist Sit to supine: Min assist   General bed mobility comments: guide LLE  Transfers Overall transfer level: Needs assistance Equipment used: Rolling walker (2 wheeled) Transfers: Sit to/from Stand Sit to Stand: Min guard;Min assist         General transfer comment: verbal cues for hand placement and LLE positioning  Ambulation/Gait Ambulation/Gait assistance: Min guard Ambulation Distance (Feet): 50 Feet Assistive device: Rolling walker (2 wheeled) Gait Pattern/deviations: Step-to pattern;Step-through pattern;Decreased weight shift to left     General Gait Details: cues for RW safety   Stairs            Wheelchair Mobility    Modified Rankin (Stroke Patients Only)       Balance                                            Cognition Arousal/Alertness: Awake/alert Behavior During Therapy: WFL for tasks assessed/performed Overall Cognitive Status: Within Functional Limits for tasks assessed                                         Exercises Total Joint Exercises Ankle Circles/Pumps: AROM;Both;10 reps    General Comments        Pertinent Vitals/Pain Pain Assessment: 0-10 Pain Score: 10-Worst pain ever Pain Location: L knee Pain Descriptors / Indicators: Guarding;Grimacing;Sore;Operative site guarding;Spasm;Cramping;Constant Pain Intervention(s): Limited activity within patient's tolerance;Monitored during session;Repositioned;Premedicated before session;Patient requesting pain meds-RN notified;Relaxation;Ice applied;Heat applied(heat to thigh)    Home Living                      Prior Function            PT Goals (current goals can now be found in the care plan section) Acute Rehab PT Goals Patient Stated Goal: home soon, get back to work PT Goal Formulation: With patient Time For Goal Achievement: 05/31/17 Potential to Achieve Goals: Good Progress towards PT goals: Progressing toward goals    Frequency    7X/week      PT Plan Current plan remains appropriate    Co-evaluation              AM-PAC PT "6 Clicks" Daily Activity  Outcome Measure  Difficulty turning over in bed (including adjusting bedclothes, sheets and blankets)?: Unable Difficulty moving from lying on back to sitting on the side of the  bed? : Unable Difficulty sitting down on and standing up from a chair with arms (e.g., wheelchair, bedside commode, etc,.)?: A Little Help needed moving to and from a bed to chair (including a wheelchair)?: A Little Help needed walking in hospital room?: A Little Help needed climbing 3-5 steps with a railing? : A Lot 6 Click Score: 13    End of Session Equipment Utilized During Treatment: Gait belt Activity Tolerance: Patient limited by pain Patient left: in bed;with call bell/phone within reach;with bed alarm set   PT Visit Diagnosis: Difficulty in walking, not elsewhere classified (R26.2)     Time: 4098-11911150-1211 PT Time Calculation (min) (ACUTE ONLY): 21 min  Charges:   $Gait Training: 8-22 mins                    G Codes:          Diana Burns 05/25/2017, 12:27 PM

## 2017-05-25 NOTE — Plan of Care (Signed)
Plan of care reviewed with patient.

## 2017-05-25 NOTE — Progress Notes (Signed)
Occupational Therapy Treatment Patient Details Name: Diana Burns MRN: [id number] DOB: [birth year] Today's Date: 05/25/2017    History of present illness s/p L TKA   OT comments  Patient with more pain today, but agreeable to OT session. Education on toilet and tub transfers, LB self-care. Patient verbalized understanding. Will have assistance at home with these tasks as needed. OT will continue to follow. Nurse made aware of patient's request for pain medication.   Follow Up Recommendations  DC plan and follow up therapy as arranged by surgeon;Supervision/Assistance - 24 hour    Equipment Recommendations  None recommended by OT    Recommendations for Other Services      Precautions / Restrictions Precautions Precautions: Knee Precaution Comments: ind SLRs Restrictions Weight Bearing Restrictions: No Other Position/Activity Restrictions: WBAT       Mobility Bed Mobility Overal bed mobility: Needs Assistance Bed Mobility: Supine to Sit;Sit to Supine     Supine to sit: Min guard;HOB elevated Sit to supine: Min guard;HOB elevated   General bed mobility comments: guide LLE  Transfers Overall transfer level: Needs assistance Equipment used: Rolling walker (2 wheeled) Transfers: Sit to/from Stand Sit to Stand: Min guard;Min assist         General transfer comment: verbal cues for hand placement and LLE positioning    Balance                                           ADL either performed or assessed with clinical judgement   ADL Overall ADL's : Needs assistance/impaired     Grooming: Wash/dry hands;Min Production designer, theatre/television/filmguard;Standing                   Toilet Transfer: Min guard;Ambulation;Comfort height toilet;RW;Grab bars   Toileting- Clothing Manipulation and Hygiene: Min guard;Sit to/from Nurse, children'sstand     Tub/Shower Transfer Details (indicate cue type and reason): verbal education and demonstration of tub transfer technique; patient verbalized  understanding.  Functional mobility during ADLs: Min guard;Rolling walker General ADL Comments: Patient in more pain today. Agreeable to session. Educated patient on tub transfer technique and to have assistance with this task at home. She verbalized understanding. Discussed need for LB bathing/dressing assistance initially and patient verbalized understanding.      Vision       Perception     Praxis      Cognition Arousal/Alertness: Awake/alert Behavior During Therapy: WFL for tasks assessed/performed Overall Cognitive Status: Within Functional Limits for tasks assessed                                          Exercises     Shoulder Instructions       General Comments      Pertinent Vitals/ Pain       Pain Assessment: 0-10 Pain Score: 9  Pain Location: L knee Pain Descriptors / Indicators: Guarding;Grimacing;Sore;Operative site guarding;Spasm;Cramping;Constant Pain Intervention(s): Limited activity within patient's tolerance;Monitored during session;Patient requesting pain meds-RN notified;Repositioned  Home Living                                          Prior Functioning/Environment  Frequency  Min 2X/week        Progress Toward Goals  OT Goals(current goals can now be found in the care plan section)  Progress towards OT goals: Progressing toward goals  Acute Rehab OT Goals Patient Stated Goal: home soon, get back to work  Plan Discharge plan remains appropriate    Co-evaluation                 AM-PAC PT "6 Clicks" Daily Activity     Outcome Measure   Help from another person eating meals?: None Help from another person taking care of personal grooming?: None Help from another person toileting, which includes using toliet, bedpan, or urinal?: A Little Help from another person bathing (including washing, rinsing, drying)?: A Little Help from another person to put on and taking off regular  upper body clothing?: None Help from another person to put on and taking off regular lower body clothing?: A Little 6 Click Score: 21    End of Session Equipment Utilized During Treatment: Rolling walker  OT Visit Diagnosis: Unsteadiness on feet (R26.81);Muscle weakness (generalized) (M62.81)   Activity Tolerance Patient limited by pain   Patient Left in bed;with call bell/phone within reach   Nurse Communication Mobility status;Patient requests pain meds        Time: 4098-11910911-0924 OT Time Calculation (min): 13 min  Charges: OT General Charges $OT Visit: 1 Visit OT Treatments $Self Care/Home Management : 8-22 mins   Cuong Moorman A Shenia Alan 05/25/2017, 9:50 AM

## 2017-05-25 NOTE — Progress Notes (Signed)
   Subjective: 2 Days Post-Op Procedure(s) (LRB): LEFT TOTAL KNEE ARTHROPLASTY (Left)  C/o moderate pain this morning Pain with movement and therapy Denies any new symptoms or issues Otherwise doing fair Patient reports pain as moderate.  Objective:   VITALS:   Vitals:   05/25/17 0411 05/25/17 0419  BP: (!) 183/90 (!) 182/84  Pulse: (!) 112   Resp: 16   Temp: (!) 100.8 F (38.2 C)   SpO2: 99%     Left knee incision healing well nv intact distally Dressing in place, ace removed  LABS Recent Labs    05/24/17 0518 05/25/17 0522  HGB 9.2* 8.6*  HCT 29.8* 28.1*  WBC 8.0 7.3  PLT 210 209    Recent Labs    05/24/17 0518  NA 134*  K 4.0  BUN 8  CREATININE 0.58  GLUCOSE 223*     Assessment/Plan: 2 Days Post-Op Procedure(s) (LRB): LEFT TOTAL KNEE ARTHROPLASTY (Left) Pt still in moderate pain Possible d/c tomorrow but I doubt d/c today Continue with therapy  Pulmonary toilet Pain management   Alphonsa OverallBrad Isaiahs Chancy, MPAS, PA-C  05/25/2017, 7:34 AM

## 2017-05-25 NOTE — Progress Notes (Signed)
Physical Therapy Treatment Patient Details Name: Diana Burns MRN: [id number] DOB: 07-11-1960 Today's Date: 05/25/2017    History of Present Illness s/p L TKA    PT Comments    Limited by pain but continues to be motivated to work within limits   Follow Up Recommendations  DC plan and follow up therapy as arranged by surgeon     Equipment Recommendations  None recommended by PT    Recommendations for Other Services       Precautions / Restrictions Precautions Precautions: Knee Precaution Comments: unable to do SLRs today d/t pain, KI  utilized Required Braces or Orthoses: Knee Immobilizer - Left Knee Immobilizer - Left: Discontinue once straight leg raise with < 10 degree lag Restrictions Other Position/Activity Restrictions: WBAT    Mobility  Bed Mobility Overal bed mobility: Needs Assistance Bed Mobility: Supine to Sit;Sit to Supine     Supine to sit: Min assist Sit to supine: Min assist   General bed mobility comments: guide LLE  Transfers Overall transfer level: Needs assistance Equipment used: Rolling walker (2 wheeled) Transfers: Sit to/from Stand Sit to Stand: Min guard;Min assist         General transfer comment: verbal cues for hand placement and LLE positioning  Ambulation/Gait Ambulation/Gait assistance: Min guard Ambulation Distance (Feet): 50 Feet Assistive device: Rolling walker (2 wheeled) Gait Pattern/deviations: Step-to pattern;Step-through pattern;Decreased weight shift to left     General Gait Details: cues for RW safety   Stairs            Wheelchair Mobility    Modified Rankin (Stroke Patients Only)       Balance                                            Cognition Arousal/Alertness: Awake/alert Behavior During Therapy: WFL for tasks assessed/performed Overall Cognitive Status: Within Functional Limits for tasks assessed                                        Exercises  Total Joint Exercises Ankle Circles/Pumps: AROM;Both;10 reps Quad Sets: AROM;Both;10 reps Short Arc QuadBarbaraann Boys: AAROM;Left;10 reps Heel Slides: AAROM;10 reps;Left Hip ABduction/ADduction: AAROM;Left;10 reps Straight Leg Raises: AAROM;AROM;Strengthening;Left;10 reps Goniometric ROM: grossly 15* to 45*, limited by pain and guarding    General Comments        Pertinent Vitals/Pain Pain Assessment: 0-10 Pain Score: 9  Pain Location: L knee Pain Descriptors / Indicators: Guarding;Grimacing;Sore;Operative site guarding;Spasm;Cramping;Constant Pain Intervention(s): Limited activity within patient's tolerance;Monitored during session;Premedicated before session    Home Living                      Prior Function            PT Goals (current goals can now be found in the care plan section) Acute Rehab PT Goals Patient Stated Goal: home soon, get back to work PT Goal Formulation: With patient Time For Goal Achievement: 05/31/17 Potential to Achieve Goals: Good Progress towards PT goals: Progressing toward goals    Frequency    7X/week      PT Plan Current plan remains appropriate    Co-evaluation              AM-PAC PT "6 Clicks" Daily Activity  Outcome Measure  Difficulty turning over in bed (including adjusting bedclothes, sheets and blankets)?: Unable Difficulty moving from lying on back to sitting on the side of the bed? : Unable Difficulty sitting down on and standing up from a chair with arms (e.g., wheelchair, bedside commode, etc,.)?: Unable Help needed moving to and from a bed to chair (including a wheelchair)?: A Little Help needed walking in hospital room?: A Little Help needed climbing 3-5 steps with a railing? : A Lot 6 Click Score: 11    End of Session Equipment Utilized During Treatment: Gait belt Activity Tolerance: Patient limited by pain Patient left: in bed;with call bell/phone within reach;with bed alarm set   PT Visit Diagnosis:  Difficulty in walking, not elsewhere classified (R26.2)     Time: 4098-11911406-1422 PT Time Calculation (min) (ACUTE ONLY): 16 min  Charges:  $Gait Training: 8-22 mins $Therapeutic Exercise: 8-22 mins                    G Codes:           Omolara Carol 05/25/2017, 3:23 PM

## 2017-05-26 ENCOUNTER — Encounter (HOSPITAL_COMMUNITY): Payer: Self-pay | Admitting: Specialist

## 2017-05-26 LAB — CBC
HEMATOCRIT: 29.5 % — AB (ref 36.0–46.0)
Hemoglobin: 9.3 g/dL — ABNORMAL LOW (ref 12.0–15.0)
MCH: 21.5 pg — ABNORMAL LOW (ref 26.0–34.0)
MCHC: 31.5 g/dL (ref 30.0–36.0)
MCV: 68.3 fL — AB (ref 78.0–100.0)
PLATELETS: 227 10*3/uL (ref 150–400)
RBC: 4.32 MIL/uL (ref 3.87–5.11)
RDW: 14.5 % (ref 11.5–15.5)
WBC: 11.1 10*3/uL — AB (ref 4.0–10.5)

## 2017-05-26 LAB — GLUCOSE, CAPILLARY
GLUCOSE-CAPILLARY: 128 mg/dL — AB (ref 65–99)
GLUCOSE-CAPILLARY: 134 mg/dL — AB (ref 65–99)
GLUCOSE-CAPILLARY: 143 mg/dL — AB (ref 65–99)
Glucose-Capillary: 147 mg/dL — ABNORMAL HIGH (ref 65–99)

## 2017-05-26 NOTE — Progress Notes (Signed)
Subjective: 3 Days Post-Op Procedure(s) (LRB): LEFT TOTAL KNEE ARTHROPLASTY (Left) Patient reports pain as moderate to left knee.  Tolerating PO's. Denies CP,SOB,or calf pain.  Objective: Vital signs in last 24 hours: Temp:  [97.6 F (36.4 C)-98.9 F (37.2 C)] 98 F (36.7 C) (12/03 1332) Pulse Rate:  [80-116] 116 (12/03 1332) Resp:  [16-20] 16 (12/03 1332) BP: (136-171)/(75-99) 171/75 (12/03 1332) SpO2:  [97 %-100 %] 97 % (12/03 1332)  Intake/Output from previous day: 12/02 0701 - 12/03 0700 In: 480 [P.O.:480] Out: -  Intake/Output this shift: Total I/O In: 120 [P.O.:120] Out: -   Recent Labs    05/24/17 0518 05/25/17 0522 05/26/17 0533  HGB 9.2* 8.6* 9.3*   Recent Labs    05/25/17 0522 05/26/17 0533  WBC 7.3 11.1*  RBC 4.07 4.32  HCT 28.1* 29.5*  PLT 209 227   Recent Labs    05/24/17 0518  NA 134*  K 4.0  CL 102  CO2 25  BUN 8  CREATININE 0.58  GLUCOSE 223*  CALCIUM 8.1*   No results for input(s): LABPT, INR in the last 72 hours.  Alert and oriented x3. RRR, Lungs clear, BS x4. Left Calf soft and non tender. L knee dressing C/D/I. No DVT signs. No signs of infection or compartment syndrome. LLE grossly neurovascularly intact.   Assessment/Plan: 3 Days Post-Op Procedure(s) (LRB): LEFT TOTAL KNEE ARTHROPLASTY (Left) Up with PT Advance diet Plan D/c home tomorrow  Markham JordanSTILWELL, Martina Brodbeck L 05/26/2017, 4:55 PM

## 2017-05-26 NOTE — Progress Notes (Signed)
Occupational Therapy Treatment Patient Details Name: Diana Burns MRN: [id number] DOB: December 02, 1960 Today's Date: 05/26/2017    History of present illness s/p L TKA   OT comments  All OT education completed and patient questions answered. No further OT needs at this time. Will sign off.  Follow Up Recommendations  DC plan and follow up therapy as arranged by surgeon;Supervision/Assistance - 24 hour    Equipment Recommendations  None recommended by OT    Recommendations for Other Services      Precautions / Restrictions Precautions Precautions: Knee Required Braces or Orthoses: Knee Immobilizer - Left Knee Immobilizer - Left: Discontinue once straight leg raise with < 10 degree lag Restrictions Weight Bearing Restrictions: No Other Position/Activity Restrictions: WBAT       Mobility Bed Mobility                  Transfers                      Balance                                           ADL either performed or assessed with clinical judgement   ADL Overall ADL's : Needs assistance/impaired                                       General ADL Comments: Reviewed LB self-care techniques and shower transfer technique with patient who verbalized understanding. Answered all questions regarding OT education. Patient reports she is getting up to bathroom multiple times a day with nursing staff and she is bathing standing at sink with nursing staff. All OT education completed and patient questions answered. Will sign off.     Vision       Perception     Praxis      Cognition Arousal/Alertness: Awake/alert Behavior During Therapy: WFL for tasks assessed/performed Overall Cognitive Status: Within Functional Limits for tasks assessed                                          Exercises     Shoulder Instructions       General Comments      Pertinent Vitals/ Pain       Pain Assessment:  0-10 Pain Score: 9  Pain Location: L knee Pain Descriptors / Indicators: Guarding;Grimacing;Sore;Operative site guarding;Spasm;Cramping;Constant Pain Intervention(s): Limited activity within patient's tolerance;Monitored during session  Home Living                                          Prior Functioning/Environment              Frequency           Progress Toward Goals  OT Goals(current goals can now be found in the care plan section)  Progress towards OT goals: Goals met/education completed, patient discharged from OT  Acute Rehab OT Goals Patient Stated Goal: home soon, get back to work OT Goal Formulation: All assessment and education complete, DC therapy  Plan All goals met and education completed, patient discharged from  OT services    Co-evaluation                 AM-PAC PT "6 Clicks" Daily Activity     Outcome Measure   Help from another person eating meals?: None Help from another person taking care of personal grooming?: None Help from another person toileting, which includes using toliet, bedpan, or urinal?: A Little Help from another person bathing (including washing, rinsing, drying)?: A Little Help from another person to put on and taking off regular upper body clothing?: None Help from another person to put on and taking off regular lower body clothing?: A Little 6 Click Score: 21    End of Session    OT Visit Diagnosis: Unsteadiness on feet (R26.81);Muscle weakness (generalized) (M62.81)   Activity Tolerance     Patient Left     Nurse Communication          Time: 3893-7342 OT Time Calculation (min): 8 min  Charges: OT General Charges $OT Visit: 1 Visit OT Treatments $Self Care/Home Management : 8-22 mins    Leila A Early 05/26/2017, 10:59 AM

## 2017-05-26 NOTE — Progress Notes (Signed)
Physical Therapy Treatment Patient Details Name: Diana Burns MRN: [id number] DOB: November 26, 1960 Today's Date: 05/26/2017    History of Present Illness s/p L TKA    PT Comments    POD # 3 Applied KI and instructed on use esp for stairs.  Tolerated an increased distance but c/o increased pain and swelling.  Practiced stairs then returned to room to perform some TKR TE's to tolerance.  Pt given handout on HEP and instructed on proper tech, freq as well as use of ICE. Pt has met goals to D/C to home   Follow Up Recommendations  DC plan and follow up therapy as arranged by surgeon;Home health PT     Equipment Recommendations  None recommended by PT    Recommendations for Other Services       Precautions / Restrictions Precautions Precautions: Knee Precaution Comments: unable to do SLRs today d/t pain, KI  utilized Required Braces or Orthoses: Knee Immobilizer - Left Knee Immobilizer - Left: Discontinue once straight leg raise with < 10 degree lag Restrictions Weight Bearing Restrictions: No Other Position/Activity Restrictions: WBAT    Mobility  Bed Mobility Overal bed mobility: Needs Assistance Bed Mobility: Supine to Sit;Sit to Supine     Supine to sit: Min assist Sit to supine: Min assist   General bed mobility comments: guide LLE and increased time  Transfers Overall transfer level: Needs assistance Equipment used: Rolling walker (2 wheeled) Transfers: Sit to/from Stand Sit to Stand: Min guard;Min assist         General transfer comment: verbal cues for hand placement and LLE positioning  Ambulation/Gait Ambulation/Gait assistance: Min guard;Supervision Ambulation Distance (Feet): 85 Feet Assistive device: Rolling walker (2 wheeled) Gait Pattern/deviations: Step-to pattern;Step-through pattern;Decreased weight shift to left Gait velocity: decreased   General Gait Details: cues for RW placement and safety with turns   Stairs Stairs: Yes   Stair  Management: One rail Right;Step to pattern;Forwards;With crutches Number of Stairs: 4 General stair comments: 25% VC's on proper crutch placement and safety.  Also instructed to wear KI for increased support.    Wheelchair Mobility    Modified Rankin (Stroke Patients Only)       Balance                                            Cognition Arousal/Alertness: Awake/alert Behavior During Therapy: WFL for tasks assessed/performed Overall Cognitive Status: Within Functional Limits for tasks assessed                                 General Comments: mild slow groggy flat       Exercises   Total Knee Replacement TE's 10 reps B LE ankle pumps 10 reps towel squeezes 10 reps knee presses   5 reps heel slides  10 reps SLR's 10 reps ABD Followed by ICE     General Comments        Pertinent Vitals/Pain Pain Assessment: No/denies pain Pain Score: 8  Pain Location: L knee, L thigh and L calf Pain Descriptors / Indicators: Guarding;Grimacing;Sore;Operative site guarding;Spasm;Cramping;Constant Pain Intervention(s): Monitored during session;Repositioned;Ice applied;Patient requesting pain meds-RN notified;RN gave pain meds during session    Home Living                      Prior Function  PT Goals (current goals can now be found in the care plan section) Acute Rehab PT Goals Patient Stated Goal: home soon, get back to work Progress towards PT goals: Progressing toward goals    Frequency    7X/week      PT Plan      Co-evaluation              AM-PAC PT "6 Clicks" Daily Activity  Outcome Measure  Difficulty turning over in bed (including adjusting bedclothes, sheets and blankets)?: Unable Difficulty moving from lying on back to sitting on the side of the bed? : Unable Difficulty sitting down on and standing up from a chair with arms (e.g., wheelchair, bedside commode, etc,.)?: Unable Help needed moving to  and from a bed to chair (including a wheelchair)?: A Little Help needed walking in hospital room?: A Little Help needed climbing 3-5 steps with a railing? : A Lot 6 Click Score: 11    End of Session Equipment Utilized During Treatment: Gait belt Activity Tolerance: Patient tolerated treatment well;Patient limited by pain Patient left: in bed;with call bell/phone within reach;with bed alarm set(recliner is NOT comfortable) Nurse Communication: (pt has met goals to D/C to home) PT Visit Diagnosis: Difficulty in walking, not elsewhere classified (R26.2)     Time: 1308-6578 PT Time Calculation (min) (ACUTE ONLY): 40 min  Charges:  $Gait Training: 8-22 mins $Therapeutic Exercise: 8-22 mins $Therapeutic Activity: 8-22 mins                    G Codes:       Rica Koyanagi  PTA [facility]  Acute  Rehab Pager      [phone]

## 2017-05-27 ENCOUNTER — Inpatient Hospital Stay (HOSPITAL_COMMUNITY): Payer: 59

## 2017-05-27 ENCOUNTER — Encounter (HOSPITAL_COMMUNITY): Payer: 59

## 2017-05-27 DIAGNOSIS — M79609 Pain in unspecified limb: Secondary | ICD-10-CM

## 2017-05-27 LAB — GLUCOSE, CAPILLARY
GLUCOSE-CAPILLARY: 165 mg/dL — AB (ref 65–99)
GLUCOSE-CAPILLARY: 128 mg/dL — AB (ref 65–99)

## 2017-05-27 NOTE — Progress Notes (Signed)
LLE venous duplex prelim: no obvious evidence of DVT. Artis Beggs Eunice, RDMS, RVT  

## 2017-05-27 NOTE — Discharge Summary (Signed)
Physician Discharge Summary  Patient ID: Diana Burns MRN: [id number] DOB/AGE: 1960-12-10 56 y.o.  Admit date: 05/23/2017 Discharge date: 05/27/2017  Admission Diagnoses:  Discharge Diagnoses:  Active Problems:   S/P knee replacement   Discharged Condition: good  Hospital Course:  Diana Burns is a 56 y.o. who was admitted to [facility]. They were brought to the operating room on 05/23/2017 and underwent Procedure(s): LEFT TOTAL KNEE ARTHROPLASTY.  Patient tolerated the procedure well and was later transferred to the recovery room and then to the orthopaedic floor for postoperative care.  They were given PO and IV analgesics for pain control following their surgery.  They were given 24 hours of postoperative antibiotics of  Anti-infectives (From admission, onward)   Start     Dose/Rate Route Frequency Ordered Stop   05/23/17 2000  ceFAZolin (ANCEF) IVPB 2g/100 mL premix     2 g 200 mL/hr over 30 Minutes Intravenous Every 6 hours 05/23/17 1728 05/24/17 0240   05/23/17 1124  ceFAZolin (ANCEF) IVPB 2g/100 mL premix     2 g 200 mL/hr over 30 Minutes Intravenous On call to O.R. 05/23/17 1124 05/23/17 1357     and started on DVT prophylaxis in the form of Lovenox.   PT and OT were ordered for total joint protocol.  Discharge planning consulted to help with postop disposition and equipment needs.  Patient had a good night on the evening of surgery and started to get up OOB with therapy on day one.  Hemovac drain was pulled without difficulty.  Continued to work with therapy into day two.  Dressing was with normal limits.  The patient had progressed with therapy and meeting their goals. Patient was seen in rounds and was ready to go home.  Consults: None  Significant Diagnostic Studies: labs and LLE doppler  Treatments: routine  Discharge Exam: Blood pressure 130/81, pulse 97, temperature 98.9 F (37.2 C), temperature source Oral, resp. rate 15, height 5' 8.5" (1.74 m),  weight 113.4 kg (250 lb), SpO2 100 %. Alert and oriented x3. RRR, Lungs clear, BS x4. L knee dressing C/D/I. No DVT signs. No signs of infection or compartment syndrome. LLE grossly neurovascularly intact.   Disposition: 01-Home or Self Care  Discharge Instructions    Call MD / Call 911   Complete by:  As directed    If you experience chest pain or shortness of breath, CALL 911 and be transported to the hospital emergency room.  If you develope a fever above 101 F, pus (white drainage) or increased drainage or redness at the wound, or calf pain, call your surgeon's office.   Constipation Prevention   Complete by:  As directed    Drink plenty of fluids.  Prune juice may be helpful.  You may use a stool softener, such as Colace (over the counter) 100 mg twice a day.  Use MiraLax (over the counter) for constipation as needed.   Diet - low sodium heart healthy   Complete by:  As directed    Discharge instructions   Complete by:  As directed    INSTRUCTIONS AFTER JOINT REPLACEMENT   Remove items at home which could result in a fall. This includes throw rugs or furniture in walking pathways ICE to the affected joint every three hours while awake for 30 minutes at a time, for at least the first 3-5 days, and then as needed for pain and swelling.  Continue to use ice for pain and swelling. You may notice swelling  that will progress down to the foot and ankle.  This is normal after surgery.  Elevate your leg when you are not up walking on it.   Continue to use the breathing machine you got in the hospital (incentive spirometer) which will help keep your temperature down.  It is common for your temperature to cycle up and down following surgery, especially at night when you are not up moving around and exerting yourself.  The breathing machine keeps your lungs expanded and your temperature down.   DIET:  As you were doing prior to hospitalization, we recommend a well-balanced diet.  DRESSING / WOUND  CARE / SHOWERING  Keep the surgical dressing until follow up.  The dressing is water proof, so you can shower without any extra covering.  IF THE DRESSING FALLS OFF or the wound gets wet inside, change the dressing with sterile gauze.  Please use good hand washing techniques before changing the dressing.  Do not use any lotions or creams on the incision until instructed by your surgeon.    ACTIVITY  Increase activity slowly as tolerated, but follow the weight bearing instructions below.   No driving for 6 weeks or until further direction given by your physician.  You cannot drive while taking narcotics.  No lifting or carrying greater than 10 lbs. until further directed by your surgeon. Avoid periods of inactivity such as sitting longer than an hour when not asleep. This helps prevent blood clots.  You may return to work once you are authorized by your doctor.     WEIGHT BEARING   Weight bearing as tolerated with assist device (walker, cane, etc) as directed, use it as long as suggested by your surgeon or therapist, typically at least 4-6 weeks.   EXERCISES  Results after joint replacement surgery are often greatly improved when you follow the exercise, range of motion and muscle strengthening exercises prescribed by your doctor. Safety measures are also important to protect the joint from further injury. Any time any of these exercises cause you to have increased pain or swelling, decrease what you are doing until you are comfortable again and then slowly increase them. If you have problems or questions, call your caregiver or physical therapist for advice.   Rehabilitation is important following a joint replacement. After just a few days of immobilization, the muscles of the leg can become weakened and shrink (atrophy).  These exercises are designed to build up the tone and strength of the thigh and leg muscles and to improve motion. Often times heat used for twenty to thirty minutes before  working out will loosen up your tissues and help with improving the range of motion but do not use heat for the first two weeks following surgery (sometimes heat can increase post-operative swelling).   These exercises can be done on a training (exercise) mat, on the floor, on a table or on a bed. Use whatever works the best and is most comfortable for you.    Use music or television while you are exercising so that the exercises are a pleasant break in your day. This will make your life better with the exercises acting as a break in your routine that you can look forward to.   Perform all exercises about fifteen times, three times per day or as directed.  You should exercise both the operative leg and the other leg as well.   Exercises include:   Quad Sets - Tighten up the muscle on the front  of the thigh (Quad) and hold for 5-10 seconds.   Straight Leg Raises - With your knee straight (if you were given a brace, keep it on), lift the leg to 60 degrees, hold for 3 seconds, and slowly lower the leg.  Perform this exercise against resistance later as your leg gets stronger.  Leg Slides: Lying on your back, slowly slide your foot toward your buttocks, bending your knee up off the floor (only go as far as is comfortable). Then slowly slide your foot back down until your leg is flat on the floor again.  Angel Wings: Lying on your back spread your legs to the side as far apart as you can without causing discomfort.  Hamstring Strength:  Lying on your back, push your heel against the floor with your leg straight by tightening up the muscles of your buttocks.  Repeat, but this time bend your knee to a comfortable angle, and push your heel against the floor.  You may put a pillow under the heel to make it more comfortable if necessary.   A rehabilitation program following joint replacement surgery can speed recovery and prevent re-injury in the future due to weakened muscles. Contact your doctor or a physical  therapist for more information on knee rehabilitation.    CONSTIPATION  Constipation is defined medically as fewer than three stools per week and severe constipation as less than one stool per week.  Even if you have a regular bowel pattern at home, your normal regimen is likely to be disrupted due to multiple reasons following surgery.  Combination of anesthesia, postoperative narcotics, change in appetite and fluid intake all can affect your bowels.   YOU MUST use at least one of the following options; they are listed in order of increasing strength to get the job done.  They are all available over the counter, and you may need to use some, POSSIBLY even all of these options:    Drink plenty of fluids (prune juice may be helpful) and high fiber foods Colace 100 mg by mouth twice a day  Senokot for constipation as directed and as needed Dulcolax (bisacodyl), take with full glass of water  Miralax (polyethylene glycol) once or twice a day as needed.  If you have tried all these things and are unable to have a bowel movement in the first 3-4 days after surgery call either your surgeon or your primary doctor.    If you experience loose stools or diarrhea, hold the medications until you stool forms back up.  If your symptoms do not get better within 1 week or if they get worse, check with your doctor.  If you experience "the worst abdominal pain ever" or develop nausea or vomiting, please contact the office immediately for further recommendations for treatment.   ITCHING:  If you experience itching with your medications, try taking only a single pain pill, or even half a pain pill at a time.  You can also use Benadryl over the counter for itching or also to help with sleep.   TED HOSE STOCKINGS:  Use stockings on both legs until for at least 2 weeks or as directed by physician office. They may be removed at night for sleeping.  MEDICATIONS:  See your medication summary on the "After Visit  Summary" that nursing will review with you.  You may have some home medications which will be placed on hold until you complete the course of blood thinner medication.  It is important for you  to complete the blood thinner medication as prescribed.  PRECAUTIONS:  If you experience chest pain or shortness of breath - call 911 immediately for transfer to the hospital emergency department.   If you develop a fever greater that 101 F, purulent drainage from wound, increased redness or drainage from wound, foul odor from the wound/dressing, or calf pain - CONTACT YOUR SURGEON.                                                   FOLLOW-UP APPOINTMENTS:  If you do not already have a post-op appointment, please call the office for an appointment to be seen by your surgeon.  Guidelines for how soon to be seen are listed in your "After Visit Summary", but are typically between 1-4 weeks after surgery.  OTHER INSTRUCTIONS:   Knee Replacement:  Do not place pillow under knee, focus on keeping the knee straight while resting. CPM instructions: 0-90 degrees, 2 hours in the morning, 2 hours in the afternoon, and 2 hours in the evening. Place foam block, curve side up under heel at all times except when in CPM or when walking.  DO NOT modify, tear, cut, or change the foam block in any way.  MAKE SURE YOU:  Understand these instructions.  Get help right away if you are not doing well or get worse.    Thank you for letting us be a part of your medical care team.  It is a privilege we respect greatly.  We hope these instructions will help you stay on track for a fast and full recovery!   Increase activity slowly as tolerated   Complete by:  As directed      Allergies as of 05/27/2017      Reactions   Aspartame And Phenylalanine Other (See Comments)   Artificial sweetners - headache   Bee Venom Swelling   Latex Hives, Itching   Phenylalanine Other (See Comments)   Artificial sweetners - headache       Medication List    TAKE these medications   aspirin EC 325 MG tablet Take 1 tablet (325 mg total) by mouth 2 (two) times daily.   atorvastatin 20 MG tablet Commonly known as:  LIPITOR Take 1 tablet (20 mg total) by mouth daily.   baclofen 10 MG tablet Commonly known as:  LIORESAL Take 1 tablet (10 mg total) by mouth 3 (three) times daily as needed for muscle spasms.   blood glucose meter kit and supplies Kit Dispense based on patient and insurance preference. Use up to four times daily as directed. (FOR ICD-9 250.00, 250.01).   JANUVIA 100 MG tablet Generic drug:  sitaGLIPtin TAKE 1 TABLET (100 MG TOTAL) BY MOUTH DAILY.   metFORMIN 500 MG 24 hr tablet Commonly known as:  GLUCOPHAGE-XR TAKE 2 TABLETS BY MOUTH TWICE A DAY   multivitamin with minerals Tabs tablet Take 1 tablet daily by mouth.   oxyCODONE 5 MG immediate release tablet Commonly known as:  ROXICODONE Take 1 tablet (5 mg total) by mouth every 4 (four) hours as needed.        SignedLajean Manes 05/27/2017, 8:14 AM

## 2017-05-27 NOTE — Progress Notes (Signed)
Subjective: 4 Days Post-Op Procedure(s) (LRB): LEFT TOTAL KNEE ARTHROPLASTY (Left) Patient reports pain as mild to knee. Patient reports calf pain to touch. Tolerating PO's. Denies SOB or CP. No F/C.    Objective: Vital signs in last 24 hours: Temp:  [97.6 F (36.4 C)-98.9 F (37.2 C)] 98.9 F (37.2 C) (12/04 0650) Pulse Rate:  [80-116] 97 (12/04 0650) Resp:  [15-18] 15 (12/04 0650) BP: (130-171)/(75-81) 130/81 (12/04 0650) SpO2:  [97 %-100 %] 100 % (12/04 0650)  Intake/Output from previous day: 12/03 0701 - 12/04 0700 In: 600 [P.O.:600] Out: -  Intake/Output this shift: No intake/output data recorded.  Recent Labs    05/25/17 0522 05/26/17 0533  HGB 8.6* 9.3*   Recent Labs    05/25/17 0522 05/26/17 0533  WBC 7.3 11.1*  RBC 4.07 4.32  HCT 28.1* 29.5*  PLT 209 227   No results for input(s): NA, K, CL, CO2, BUN, CREATININE, GLUCOSE, CALCIUM in the last 72 hours. No results for input(s): LABPT, INR in the last 72 hours.  Alert and oriented x3. RRR, Lungs clear, BS x4. Left Calf  Tender to touch. L knee dressing C/D/I. No DVT signs. No signs of infection or compartment syndrome. LLE grossly neurovascularly intact.   Assessment/Plan: 4 Days Post-Op Procedure(s) (LRB): LEFT TOTAL KNEE ARTHROPLASTY (Left) Doppler this am to Left LE Up with PT D/c home later today if doppler negative F/u in office Follow instructions  STILWELL, BRYSON L 05/27/2017, 8:11 AM

## 2017-05-28 DIAGNOSIS — Z471 Aftercare following joint replacement surgery: Secondary | ICD-10-CM | POA: Diagnosis not present

## 2017-05-28 DIAGNOSIS — E119 Type 2 diabetes mellitus without complications: Secondary | ICD-10-CM | POA: Diagnosis not present

## 2017-05-29 ENCOUNTER — Ambulatory Visit: Payer: 59 | Admitting: Internal Medicine

## 2017-05-29 DIAGNOSIS — Z471 Aftercare following joint replacement surgery: Secondary | ICD-10-CM | POA: Diagnosis not present

## 2017-05-29 DIAGNOSIS — E119 Type 2 diabetes mellitus without complications: Secondary | ICD-10-CM | POA: Diagnosis not present

## 2017-06-03 DIAGNOSIS — E119 Type 2 diabetes mellitus without complications: Secondary | ICD-10-CM | POA: Diagnosis not present

## 2017-06-03 DIAGNOSIS — Z471 Aftercare following joint replacement surgery: Secondary | ICD-10-CM | POA: Diagnosis not present

## 2017-06-04 DIAGNOSIS — Z96652 Presence of left artificial knee joint: Secondary | ICD-10-CM | POA: Diagnosis not present

## 2017-06-04 DIAGNOSIS — Z471 Aftercare following joint replacement surgery: Secondary | ICD-10-CM | POA: Diagnosis not present

## 2017-06-05 DIAGNOSIS — Z471 Aftercare following joint replacement surgery: Secondary | ICD-10-CM | POA: Diagnosis not present

## 2017-06-05 DIAGNOSIS — E119 Type 2 diabetes mellitus without complications: Secondary | ICD-10-CM | POA: Diagnosis not present

## 2017-06-06 DIAGNOSIS — Z471 Aftercare following joint replacement surgery: Secondary | ICD-10-CM | POA: Diagnosis not present

## 2017-06-06 DIAGNOSIS — E119 Type 2 diabetes mellitus without complications: Secondary | ICD-10-CM | POA: Diagnosis not present

## 2017-06-09 DIAGNOSIS — Z471 Aftercare following joint replacement surgery: Secondary | ICD-10-CM | POA: Diagnosis not present

## 2017-06-09 DIAGNOSIS — E119 Type 2 diabetes mellitus without complications: Secondary | ICD-10-CM | POA: Diagnosis not present

## 2017-06-11 DIAGNOSIS — Z471 Aftercare following joint replacement surgery: Secondary | ICD-10-CM | POA: Diagnosis not present

## 2017-06-11 DIAGNOSIS — E119 Type 2 diabetes mellitus without complications: Secondary | ICD-10-CM | POA: Diagnosis not present

## 2017-06-13 DIAGNOSIS — Z471 Aftercare following joint replacement surgery: Secondary | ICD-10-CM | POA: Diagnosis not present

## 2017-06-13 DIAGNOSIS — E119 Type 2 diabetes mellitus without complications: Secondary | ICD-10-CM | POA: Diagnosis not present

## 2017-06-16 DIAGNOSIS — E119 Type 2 diabetes mellitus without complications: Secondary | ICD-10-CM | POA: Diagnosis not present

## 2017-06-16 DIAGNOSIS — Z471 Aftercare following joint replacement surgery: Secondary | ICD-10-CM | POA: Diagnosis not present

## 2017-06-18 DIAGNOSIS — M25562 Pain in left knee: Secondary | ICD-10-CM | POA: Diagnosis not present

## 2017-06-20 DIAGNOSIS — M25562 Pain in left knee: Secondary | ICD-10-CM | POA: Diagnosis not present

## 2017-06-23 DIAGNOSIS — M25562 Pain in left knee: Secondary | ICD-10-CM | POA: Diagnosis not present

## 2017-06-25 DIAGNOSIS — M25562 Pain in left knee: Secondary | ICD-10-CM | POA: Diagnosis not present

## 2017-06-27 DIAGNOSIS — M25569 Pain in unspecified knee: Secondary | ICD-10-CM | POA: Diagnosis not present

## 2017-07-01 DIAGNOSIS — M25562 Pain in left knee: Secondary | ICD-10-CM | POA: Diagnosis not present

## 2017-07-02 DIAGNOSIS — Z96652 Presence of left artificial knee joint: Secondary | ICD-10-CM | POA: Diagnosis not present

## 2017-07-03 DIAGNOSIS — M25569 Pain in unspecified knee: Secondary | ICD-10-CM | POA: Diagnosis not present

## 2017-07-24 ENCOUNTER — Other Ambulatory Visit: Payer: Self-pay

## 2017-07-24 ENCOUNTER — Other Ambulatory Visit: Payer: Self-pay | Admitting: Internal Medicine

## 2017-07-24 MED ORDER — SITAGLIPTIN PHOSPHATE 100 MG PO TABS: 100.0000 mg | ORAL_TABLET | Freq: Every day | ORAL | 0 refills | Status: DC

## 2017-07-25 DIAGNOSIS — M25562 Pain in left knee: Secondary | ICD-10-CM | POA: Diagnosis not present

## 2017-07-28 DIAGNOSIS — M25569 Pain in unspecified knee: Secondary | ICD-10-CM | POA: Diagnosis not present

## 2017-07-30 DIAGNOSIS — M25569 Pain in unspecified knee: Secondary | ICD-10-CM | POA: Diagnosis not present

## 2017-08-01 DIAGNOSIS — M25562 Pain in left knee: Secondary | ICD-10-CM | POA: Diagnosis not present

## 2017-08-04 DIAGNOSIS — M25569 Pain in unspecified knee: Secondary | ICD-10-CM | POA: Diagnosis not present

## 2017-08-06 DIAGNOSIS — M25569 Pain in unspecified knee: Secondary | ICD-10-CM | POA: Diagnosis not present

## 2017-08-07 ENCOUNTER — Other Ambulatory Visit: Payer: Self-pay | Admitting: Internal Medicine

## 2017-08-08 DIAGNOSIS — M25569 Pain in unspecified knee: Secondary | ICD-10-CM | POA: Diagnosis not present

## 2017-08-11 ENCOUNTER — Encounter: Payer: Self-pay | Admitting: Internal Medicine

## 2017-08-11 ENCOUNTER — Ambulatory Visit: Payer: 59 | Admitting: Internal Medicine

## 2017-08-11 VITALS — BP 136/82 | HR 90 | Ht 68.5 in | Wt 231.8 lb

## 2017-08-11 DIAGNOSIS — M25662 Stiffness of left knee, not elsewhere classified: Secondary | ICD-10-CM | POA: Diagnosis not present

## 2017-08-11 DIAGNOSIS — Z471 Aftercare following joint replacement surgery: Secondary | ICD-10-CM | POA: Diagnosis not present

## 2017-08-11 DIAGNOSIS — E1165 Type 2 diabetes mellitus with hyperglycemia: Secondary | ICD-10-CM

## 2017-08-11 DIAGNOSIS — Z96652 Presence of left artificial knee joint: Secondary | ICD-10-CM | POA: Diagnosis not present

## 2017-08-11 DIAGNOSIS — E785 Hyperlipidemia, unspecified: Secondary | ICD-10-CM | POA: Diagnosis not present

## 2017-08-11 DIAGNOSIS — E669 Obesity, unspecified: Secondary | ICD-10-CM | POA: Diagnosis not present

## 2017-08-11 LAB — POCT GLYCOSYLATED HEMOGLOBIN (HGB A1C): HEMOGLOBIN A1C: 6.6

## 2017-08-11 NOTE — Progress Notes (Signed)
Patient ID: Diana Burns, female   DOB: Sep 12, 1960, 57 y.o.   MRN: [id number]   HPI: Diana Burns is a 57 y.o.-year-old female, initially referred by her PCP, Dr. Everlene Farrier , now returning for follow-up for DM2, dx in 10/2015, non-insulin-dependent, uncontrolled, without long term complications. Last visit 3 mo ago.  Since early 2018 >> joined a gym and works on improving diet: mostly fruit/veggies, poultry, no fried food, less bread. She was told in the past she does not qualify for GBP. However, since last visit, she lost almost 20 lbs!  She had TKR in 04/2017 >> will need a revision. She is in PT, but not in the gym yet.  Reviewed hx: Pt was dx'ed with DM after an injury to her finger >> ED >> labs showed a Glu in the 300. Before diagnosis, She had a period of few months of increased stress  - very busy, many deaths among family and close friends. She was eating once a day, sleeping 2-3 hours a day - working days and nights. She also had steroid inj in knee (she will need to have total knee replacement soon), Prednisone for a tooth abscess, and Prednisone for laryngitis.  Since diagnosis, she was able to improve her blood sugars, and also worked on her diet, so that last HbA1c from Fall 2017 has improved significantly. Unfortunately, she was lost to follow-up afterwards until 10/2016.  Last hemoglobin A1c was: Lab Results  Component Value Date   HGBA1C 6.9 (H) 05/20/2017   HGBA1C 7.6% 02/27/2017   HGBA1C 8.1 11/21/2016   Pt is on a regimen of:  - Metformin ER 1000 mg 2x a day - Januvia 100 mg in am >> ran out 1 week ago  Pt checks her sugars 1x a day: - am: 172-240 >> 107-127 >> 110-121 >> 73, 100-120 >> 81-110s - 2h after b'fast: n/c >> 128-143 >> n/c - before lunch: n/c - 2h after lunch: n/c >> 139 >> n/c - before dinner: 101-128, 141 >> 104-110 >> 103-110 >> 100-106 - 2h after dinner: n/c >> 117-170 >> n/c - bedtime: n/c - nighttime: n/c Lowest sugar was 73 >> 81. Unclear at which  level she has hypoglycemia awareness. Highest sugar was 170 >> 118.  Glucometer: One Touch  Pt's meals are: - Breakfast: Eggs, salmon, sausage, but mostly protein shake - Lunch: salad or skips - Dinner: salad or sandwich, burger, seafood - Snacks: 0-2, unsalted peanuts, pistachios  - No CKD, last BUN/creatinine:  Lab Results  Component Value Date   BUN 8 05/24/2017   BUN 15 05/20/2017   CREATININE 0.58 05/24/2017   CREATININE 0.62 05/20/2017   - + HL; last set of lipids: Lab Results  Component Value Date   CHOL 115 02/27/2017   HDL 55.30 02/27/2017   LDLCALC 52 02/27/2017   TRIG 36.0 02/27/2017   CHOLHDL 2 02/27/2017  On Lipitor. - last eye exam was in 02/2016 >> No DR - no numbness and tingling in her feet.  ROS: Constitutional: no weight gain/no weight loss, no fatigue, no subjective hyperthermia, no subjective hypothermia Eyes: no blurry vision, no xerophthalmia ENT: no sore throat, no nodules palpated in throat, no dysphagia, no odynophagia, no hoarseness Cardiovascular: no CP/no SOB/no palpitations/no leg swelling Respiratory: no cough/no SOB/no wheezing Gastrointestinal: no N/no V/no D/no C/no acid reflux Musculoskeletal: no muscle aches/+ joint aches Skin: no rashes, no hair loss Neurological: no tremors/no numbness/no tingling/no dizziness  I reviewed pt's medications, allergies, PMH, social hx, family hx,  and changes were documented in the history of present illness. Otherwise, unchanged from my initial visit note.   Past Medical History:  Diagnosis Date  . Allergy   . Anemia   . Arthritis   . Diabetes mellitus without complication [facility])    Past Surgical History:  Procedure Laterality Date  . KNEE SURGERY    . 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 Marital status: Single     Spouse name: N/A  . Number of children: 0   Occupational History  . Machine Garment/textile technologist    Social History Main Topics  . Smoking status: Never Smoker  . Smokeless tobacco: Not on file  . Alcohol use Yes - wine and mixed drinks rarely   . Drug use: No   Social History Narrative   Raised between [place] and [place].   Current Outpatient Medications on File Prior to Visit  Medication Sig Dispense Refill  . aspirin EC 325 MG tablet Take 1 tablet (325 mg total) by mouth 2 (two) times daily. 60 tablet 0  . atorvastatin (LIPITOR) 20 MG tablet Take 1 tablet (20 mg total) by mouth daily. 90 tablet 3  . baclofen (LIORESAL) 10 MG tablet Take 1 tablet (10 mg total) by mouth 3 (three) times daily as needed for muscle spasms. 50 tablet 1  . blood glucose meter kit and supplies KIT Dispense based on patient and insurance preference. Use up to four times daily as directed. (FOR ICD-9 250.00, 250.01). 1 each 0  . metFORMIN (GLUCOPHAGE-XR) 500 MG 24 hr tablet TAKE 2 TABLETS BY MOUTH TWICE A DAY 360 tablet 3  . Multiple Vitamin (MULTIVITAMIN WITH MINERALS) TABS tablet Take 1 tablet daily by mouth.    . oxyCODONE (ROXICODONE) 5 MG immediate release tablet Take 1 tablet (5 mg total) by mouth every 4 (four) hours as needed. 40 tablet 0  . sitaGLIPtin (JANUVIA) 100 MG tablet Take 1 tablet (100 mg total) by mouth daily. 90 tablet 0   No current facility-administered medications on file prior to visit.    Allergies  Allergen Reactions  . Aspartame And Phenylalanine Other (See Comments)    Artificial sweetners - headache  . Bee Venom Swelling  . Latex Hives and Itching  . Phenylalanine Other (See Comments)    Artificial sweetners - headache   Family History  Problem Relation Age of Onset  . Cancer Mother   . Heart disease Mother   . Hyperlipidemia Mother   . Hypertension Mother    PE: BP 136/82 (BP Location: Left Arm, Patient Position: Sitting, Cuff Size: Large)   Pulse 90   Ht  5' 8.5" (1.74 m)   Wt 231 lb 12.8 oz (105.1 kg)   BMI 34.73 kg/m  Wt Readings from Last 3 Encounters:  08/11/17 231 lb 12.8 oz (105.1 kg)  05/23/17 250 lb (113.4 kg)  05/20/17 250 lb (113.4 kg)   Constitutional: obese, in NAD Eyes: PERRLA, EOMI, no exophthalmos ENT: moist mucous membranes, no thyromegaly, no cervical lymphadenopathy Cardiovascular: RRR, No MRG Respiratory: CTA B Gastrointestinal: abdomen soft, NT, ND, BS+ Musculoskeletal: no deformities, strength intact in all 4 Skin: moist, warm, no rashes Neurological: no tremor with outstretched hands, DTR normal in all 4  ASSESSMENT: 1. DM2, non-insulin-dependent, uncontrolled, without long term complications but with hyperglycemia  2. Obesity class 1  BMI Classification:  < 18.5 underweight   18.5-24.9 normal weight   25.0-29.9 overweight   30.0-34.9 class I obesity   35.0-39.9 class II obesity   ? 40.0 class III obesity   3. HL  PLAN:  1. Patient with relatively recent dx of DM, with improving control after she started to pay more attention to diet, started to exercise, and taking medicines as prescribed.  At last visit, sugars were better, with few hyperglycemic spikes.  I suggested a plant-based diet and given references. - she is doing great with her diet now, and will soon start working out at the gym (for now, she is only in PT) - she ran out of Januvia >> I do not feel we need to restart Januvia but I advised her to let me know if sugars increase >> we will need to restart it then - I suggested to:  Patient Instructions  Please continue: - Metformin ER 1000 mg 2x a day - Januvia 100 mg in am  Please return in 4 months with your sugar log.    - today, HbA1c is 6.6% (better) - continue checking sugars at different times of the day - check 1x a day, rotating checks - advised for yearly eye exams >> she is not UTD - Return to clinic in 4 mo with sugar log   2. Obesity class 1 - Reviewed her weight and  her BMI along with the patient >> she lost ~20 lbs in last 3 months! I Congratulated her!  3. HL - reviewed latest Lipid panel from 09/201 >> LDL at goal, TG normal - cont's Lipitor w/o SEs - her diet and exercise should greatly help  Philemon Kingdom, MD PhD [facility]

## 2017-08-11 NOTE — Patient Instructions (Signed)
Please continue: - Metformin ER 1000 mg 2x a day  For now, stay off: - Januvia   Please return in 4 months with your sugar log.

## 2017-09-09 DIAGNOSIS — Z96652 Presence of left artificial knee joint: Secondary | ICD-10-CM | POA: Diagnosis not present

## 2017-09-09 DIAGNOSIS — M25662 Stiffness of left knee, not elsewhere classified: Secondary | ICD-10-CM | POA: Diagnosis not present

## 2017-09-09 DIAGNOSIS — G8918 Other acute postprocedural pain: Secondary | ICD-10-CM | POA: Diagnosis not present

## 2017-09-09 DIAGNOSIS — M24662 Ankylosis, left knee: Secondary | ICD-10-CM | POA: Diagnosis not present

## 2017-09-10 DIAGNOSIS — M25569 Pain in unspecified knee: Secondary | ICD-10-CM | POA: Diagnosis not present

## 2017-09-11 DIAGNOSIS — M25562 Pain in left knee: Secondary | ICD-10-CM | POA: Diagnosis not present

## 2017-09-12 DIAGNOSIS — M25569 Pain in unspecified knee: Secondary | ICD-10-CM | POA: Diagnosis not present

## 2017-09-15 DIAGNOSIS — M25569 Pain in unspecified knee: Secondary | ICD-10-CM | POA: Diagnosis not present

## 2017-09-16 DIAGNOSIS — M25569 Pain in unspecified knee: Secondary | ICD-10-CM | POA: Diagnosis not present

## 2017-09-17 DIAGNOSIS — M25569 Pain in unspecified knee: Secondary | ICD-10-CM | POA: Diagnosis not present

## 2017-09-18 DIAGNOSIS — M25569 Pain in unspecified knee: Secondary | ICD-10-CM | POA: Diagnosis not present

## 2017-09-19 DIAGNOSIS — M25569 Pain in unspecified knee: Secondary | ICD-10-CM | POA: Diagnosis not present

## 2017-09-22 DIAGNOSIS — M25562 Pain in left knee: Secondary | ICD-10-CM | POA: Diagnosis not present

## 2017-09-23 DIAGNOSIS — M25562 Pain in left knee: Secondary | ICD-10-CM | POA: Diagnosis not present

## 2017-09-26 DIAGNOSIS — M25562 Pain in left knee: Secondary | ICD-10-CM | POA: Diagnosis not present

## 2017-09-29 DIAGNOSIS — M25562 Pain in left knee: Secondary | ICD-10-CM | POA: Diagnosis not present

## 2017-10-01 DIAGNOSIS — M25562 Pain in left knee: Secondary | ICD-10-CM | POA: Diagnosis not present

## 2017-10-03 DIAGNOSIS — M25562 Pain in left knee: Secondary | ICD-10-CM | POA: Diagnosis not present

## 2017-10-06 DIAGNOSIS — M25562 Pain in left knee: Secondary | ICD-10-CM | POA: Diagnosis not present

## 2017-10-08 DIAGNOSIS — M25562 Pain in left knee: Secondary | ICD-10-CM | POA: Diagnosis not present

## 2017-10-13 DIAGNOSIS — M25562 Pain in left knee: Secondary | ICD-10-CM | POA: Diagnosis not present

## 2017-10-17 DIAGNOSIS — M25569 Pain in unspecified knee: Secondary | ICD-10-CM | POA: Diagnosis not present

## 2017-10-21 DIAGNOSIS — M25569 Pain in unspecified knee: Secondary | ICD-10-CM | POA: Diagnosis not present

## 2017-10-24 DIAGNOSIS — M25562 Pain in left knee: Secondary | ICD-10-CM | POA: Diagnosis not present

## 2017-10-27 DIAGNOSIS — M25562 Pain in left knee: Secondary | ICD-10-CM | POA: Diagnosis not present

## 2017-10-29 DIAGNOSIS — M25569 Pain in unspecified knee: Secondary | ICD-10-CM | POA: Diagnosis not present

## 2017-10-31 DIAGNOSIS — M25569 Pain in unspecified knee: Secondary | ICD-10-CM | POA: Diagnosis not present

## 2017-11-03 DIAGNOSIS — M25561 Pain in right knee: Secondary | ICD-10-CM | POA: Diagnosis not present

## 2017-11-05 DIAGNOSIS — M25569 Pain in unspecified knee: Secondary | ICD-10-CM | POA: Diagnosis not present

## 2017-11-12 DIAGNOSIS — M25562 Pain in left knee: Secondary | ICD-10-CM | POA: Diagnosis not present

## 2017-11-14 DIAGNOSIS — M25562 Pain in left knee: Secondary | ICD-10-CM | POA: Diagnosis not present

## 2017-11-18 ENCOUNTER — Other Ambulatory Visit: Payer: Self-pay | Admitting: Internal Medicine

## 2017-11-18 DIAGNOSIS — IMO0002 Reserved for concepts with insufficient information to code with codable children: Secondary | ICD-10-CM

## 2017-11-18 DIAGNOSIS — M25562 Pain in left knee: Secondary | ICD-10-CM | POA: Diagnosis not present

## 2017-11-18 DIAGNOSIS — E1165 Type 2 diabetes mellitus with hyperglycemia: Secondary | ICD-10-CM

## 2017-11-18 DIAGNOSIS — E118 Type 2 diabetes mellitus with unspecified complications: Principal | ICD-10-CM

## 2017-11-21 DIAGNOSIS — M25562 Pain in left knee: Secondary | ICD-10-CM | POA: Diagnosis not present

## 2017-11-24 DIAGNOSIS — M25562 Pain in left knee: Secondary | ICD-10-CM | POA: Diagnosis not present

## 2017-12-01 DIAGNOSIS — Z4789 Encounter for other orthopedic aftercare: Secondary | ICD-10-CM | POA: Diagnosis not present

## 2017-12-01 DIAGNOSIS — M25562 Pain in left knee: Secondary | ICD-10-CM | POA: Diagnosis not present

## 2017-12-08 DIAGNOSIS — M25562 Pain in left knee: Secondary | ICD-10-CM | POA: Diagnosis not present

## 2017-12-09 ENCOUNTER — Ambulatory Visit: Payer: 59 | Admitting: Internal Medicine

## 2017-12-15 DIAGNOSIS — M25569 Pain in unspecified knee: Secondary | ICD-10-CM | POA: Diagnosis not present

## 2017-12-19 DIAGNOSIS — M25569 Pain in unspecified knee: Secondary | ICD-10-CM | POA: Diagnosis not present

## 2017-12-23 DIAGNOSIS — M25569 Pain in unspecified knee: Secondary | ICD-10-CM | POA: Diagnosis not present

## 2017-12-26 DIAGNOSIS — M25569 Pain in unspecified knee: Secondary | ICD-10-CM | POA: Diagnosis not present

## 2017-12-30 DIAGNOSIS — M25569 Pain in unspecified knee: Secondary | ICD-10-CM | POA: Diagnosis not present

## 2018-01-02 DIAGNOSIS — M25569 Pain in unspecified knee: Secondary | ICD-10-CM | POA: Diagnosis not present

## 2018-01-06 DIAGNOSIS — M25562 Pain in left knee: Secondary | ICD-10-CM | POA: Diagnosis not present

## 2018-01-09 DIAGNOSIS — M25569 Pain in unspecified knee: Secondary | ICD-10-CM | POA: Diagnosis not present

## 2018-01-12 DIAGNOSIS — M25562 Pain in left knee: Secondary | ICD-10-CM | POA: Diagnosis not present

## 2018-01-18 ENCOUNTER — Other Ambulatory Visit: Payer: Self-pay | Admitting: Internal Medicine

## 2018-01-18 DIAGNOSIS — E118 Type 2 diabetes mellitus with unspecified complications: Principal | ICD-10-CM

## 2018-01-18 DIAGNOSIS — E1165 Type 2 diabetes mellitus with hyperglycemia: Secondary | ICD-10-CM

## 2018-01-18 DIAGNOSIS — IMO0002 Reserved for concepts with insufficient information to code with codable children: Secondary | ICD-10-CM

## 2018-01-28 DIAGNOSIS — M25569 Pain in unspecified knee: Secondary | ICD-10-CM | POA: Diagnosis not present

## 2018-02-27 DIAGNOSIS — M25662 Stiffness of left knee, not elsewhere classified: Secondary | ICD-10-CM | POA: Diagnosis not present

## 2018-03-31 ENCOUNTER — Encounter (INDEPENDENT_AMBULATORY_CARE_PROVIDER_SITE_OTHER): Payer: Self-pay

## 2018-03-31 ENCOUNTER — Encounter: Payer: Self-pay | Admitting: Internal Medicine

## 2018-03-31 ENCOUNTER — Ambulatory Visit: Payer: 59 | Admitting: Internal Medicine

## 2018-03-31 ENCOUNTER — Encounter

## 2018-03-31 VITALS — BP 128/86 | HR 74 | Ht 68.5 in | Wt 225.0 lb

## 2018-03-31 DIAGNOSIS — E1165 Type 2 diabetes mellitus with hyperglycemia: Secondary | ICD-10-CM | POA: Diagnosis not present

## 2018-03-31 LAB — COMPLETE METABOLIC PANEL WITH GFR
AG Ratio: 1.5 (calc) (ref 1.0–2.5)
ALBUMIN MSPROF: 3.9 g/dL (ref 3.6–5.1)
ALT: 13 U/L (ref 6–29)
AST: 16 U/L (ref 10–35)
Alkaline phosphatase (APISO): 55 U/L (ref 33–130)
BUN: 13 mg/dL (ref 7–25)
CALCIUM: 8.8 mg/dL (ref 8.6–10.4)
CO2: 25 mmol/L (ref 20–32)
Chloride: 105 mmol/L (ref 98–110)
Creat: 0.6 mg/dL (ref 0.50–1.05)
GFR, EST AFRICAN AMERICAN: 120 mL/min/{1.73_m2} (ref >=60)
GFR, EST NON AFRICAN AMERICAN: 103 mL/min/{1.73_m2} (ref >=60)
Globulin: 2.6 g/dL (calc) (ref 1.9–3.7)
Glucose, Bld: 92 mg/dL (ref 65–99)
POTASSIUM: 3.5 mmol/L (ref 3.5–5.3)
Sodium: 141 mmol/L (ref 135–146)
TOTAL PROTEIN: 6.5 g/dL (ref 6.1–8.1)
Total Bilirubin: 0.7 mg/dL (ref 0.2–1.2)

## 2018-03-31 LAB — LIPID PANEL
CHOL/HDL RATIO: 2
Cholesterol: 104 mg/dL (ref 0–200)
HDL: 60.7 mg/dL (ref >39.00)
LDL Cholesterol: 34 mg/dL (ref 0–99)
NONHDL: 42.88
Triglycerides: 42 mg/dL (ref 0.0–149.0)
VLDL: 8.4 mg/dL (ref 0.0–40.0)

## 2018-03-31 LAB — MICROALBUMIN / CREATININE URINE RATIO
Creatinine,U: 135.8 mg/dL
MICROALB/CREAT RATIO: 1.7 mg/g (ref 0.0–30.0)
Microalb, Ur: 2.3 mg/dL — ABNORMAL HIGH (ref 0.0–1.9)

## 2018-03-31 LAB — POCT GLYCOSYLATED HEMOGLOBIN (HGB A1C): HEMOGLOBIN A1C: 6.8 % — AB (ref 4.0–5.6)

## 2018-03-31 NOTE — Progress Notes (Signed)
Patient ID: Diana Burns, female   DOB: [birth year], 57 y.o.   MRN: [id number]   HPI: Diana Burns is a 57 y.o.-year-old female, initially referred by her PCP, Dr. Everlene Farrier , now returning for follow-up for DM2, dx in 10/2015, non-insulin-dependent, uncontrolled, without long term complications. Last visit 8 months ago.  Since early 2018, she joined the gym and started to work on improving her diet by including mostly fruit and veggies, poultry, no fried foods, less bread.  She has done very well in her sugars and HbA1c levels improved.  Since last visit, she had a lot of stress (house - foreclosed  -by a mistake). Also, more fruit over the summer.  She had TKR in 04/2018 and a revision in 08/2017.  She was seen PT.  Reviewed hx: Pt was dx'ed with DM after an injury to her finger >> ED >> labs showed a Glu in the 300. Before diagnosis, She had a period of few months of increased stress  - very busy, many deaths among family and close friends. She was eating once a day, sleeping 2-3 hours a day - working days and nights. She also had steroid inj in knee (she will need to have total knee replacement soon), Prednisone for a tooth abscess, and Prednisone for laryngitis.  Last hemoglobin A1c was: Lab Results  Component Value Date   HGBA1C 6.6 08/11/2017   HGBA1C 6.9 (H) 05/20/2017   HGBA1C 7.6% 02/27/2017   Pt is on a regimen of:  - Metformin ER 1000 mg 2x a day - .  Pt checks her sugars 0-1x a day: - am:  73, 100-120 >> 81-110s >> n/c - 2h after b'fast: n/c >> 128-143 >> n/c - before lunch: n/c - 2h after lunch: n/c >> 139 >> n/c - before dinner: 103-110 >> 100-106 >> n/c - 2h after dinner: n/c >> 117-170 >> 150-180 - bedtime: n/c - nighttime: n/c Lowest sugar was 73 >> 81 >> 96.  It is unclear at which level she has hypoglycemia awareness. Highest sugar was 170 >> 118 >> 212 (b'day party).  Glucometer: One Touch  Pt's meals are: - Breakfast: Eggs, salmon, sausage, but mostly protein  shake - Lunch: salad or skips - Dinner: salad or sandwich, burger, seafood - Snacks: 0-2, unsalted peanuts, pistachios  -No CKD, last BUN/creatinine:  Lab Results  Component Value Date   BUN 8 05/24/2017   BUN 15 05/20/2017   CREATININE 0.58 05/24/2017   CREATININE 0.62 05/20/2017   -+ HL; last set of lipids: Lab Results  Component Value Date   CHOL 115 02/27/2017   HDL 55.30 02/27/2017   LDLCALC 52 02/27/2017   TRIG 36.0 02/27/2017   CHOLHDL 2 02/27/2017  On Lipitor. - last eye exam was in 02/2016: No DR -No numbness and tingling in her feet.  ROS: Constitutional: no weight gain/no weight loss, no fatigue, no subjective hyperthermia, no subjective hypothermia Eyes: no blurry vision, no xerophthalmia ENT: no sore throat, no nodules palpated in throat, no dysphagia, no odynophagia, no hoarseness Cardiovascular: no CP/no SOB/no palpitations/no leg swelling Respiratory: no cough/no SOB/no wheezing Gastrointestinal: no N/no V/no D/no C/no acid reflux Musculoskeletal: no muscle aches/no joint aches Skin: no rashes, no hair loss Neurological: no tremors/no numbness/no tingling/no dizziness  I reviewed pt's medications, allergies, PMH, social hx, family hx, and changes were documented in the history of present illness. Otherwise, unchanged from my initial visit note.   Past Medical History:  Diagnosis Date  . Allergy   .  Anemia   . Arthritis   . Diabetes mellitus without complication [facility])    Past Surgical History:  Procedure Laterality Date  . KNEE SURGERY    . 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 Marital status: Single    Spouse name: N/A  . Number of children: 0   Occupational History  . Machine Garment/textile technologist    Social History Main Topics  . Smoking status: Never Smoker  .  Smokeless tobacco: Not on file  . Alcohol use Yes - wine and mixed drinks rarely   . Drug use: No   Social History Narrative   Raised between [place] and [place].   Current Outpatient Medications on File Prior to Visit  Medication Sig Dispense Refill  . aspirin EC 325 MG tablet Take 1 tablet (325 mg total) by mouth 2 (two) times daily. 60 tablet 0  . atorvastatin (LIPITOR) 20 MG tablet TAKE 1 TABLET BY MOUTH EVERY DAY 90 tablet 3  . baclofen (LIORESAL) 10 MG tablet Take 1 tablet (10 mg total) by mouth 3 (three) times daily as needed for muscle spasms. 50 tablet 1  . blood glucose meter kit and supplies KIT Dispense based on patient and insurance preference. Use up to four times daily as directed. (FOR ICD-9 250.00, 250.01). 1 each 0  . metFORMIN (GLUCOPHAGE-XR) 500 MG 24 hr tablet TAKE 2 TABLETS BY MOUTH TWICE A DAY 360 tablet 0  . Multiple Vitamin (MULTIVITAMIN WITH MINERALS) TABS tablet Take 1 tablet daily by mouth.    . oxyCODONE (ROXICODONE) 5 MG immediate release tablet Take 1 tablet (5 mg total) by mouth every 4 (four) hours as needed. 40 tablet 0   No current facility-administered medications on file prior to visit.    Allergies  Allergen Reactions  . Aspartame And Phenylalanine Other (See Comments)    Artificial sweetners - headache  . Bee Venom Swelling  . Latex Hives and Itching  . Phenylalanine Other (See Comments)    Artificial sweetners - headache  . Pollen Extract    Family History  Problem Relation Age of Onset  . Cancer Mother   . Heart disease Mother   . Hyperlipidemia Mother   . Hypertension Mother    PE: BP 128/86   Pulse 74   Ht 5' 8.5" (1.74 m) Comment: measured  Wt 225 lb (102.1 kg)   SpO2 97%   BMI 33.71 kg/m  Wt Readings from Last 3 Encounters:  03/31/18 225 lb (102.1 kg)  08/11/17 231 lb 12.8 oz (105.1 kg)  05/23/17 250 lb (113.4 kg)   Constitutional: overweight, in NAD Eyes: PERRLA, EOMI, no exophthalmos ENT: moist mucous membranes,  no thyromegaly, no cervical lymphadenopathy Cardiovascular: RRR, No MRG Respiratory: CTA B Gastrointestinal: abdomen soft, NT, ND, BS+ Musculoskeletal: no deformities, strength intact in all 4 Skin: moist, warm, no rashes Neurological: no tremor with outstretched hands, DTR normal in all 4  ASSESSMENT: 1. DM2, non-insulin-dependent, uncontrolled, without long term complications but with hyperglycemia  2. Obesity class 1 BMI Classification:  < 18.5 underweight   18.5-24.9 normal weight   25.0-29.9 overweight   30.0-34.9 class I obesity   35.0-39.9 class II obesity   ? 40.0 class III obesity   3. HL  PLAN:  1. Patient with relatively recent diagnosis of diabetes, with improving control after she  started to improve her diet and started to exercise.  She also started to take her medicines as prescribed. She lost almost 20 pounds before last visit and was feeling much better.  At this visit, she lost 6 more pounds since last visit, which is excellent. - Before last visit, she ran out of Januvia and we did not restart it as her sugars were excellent - At this visit, sugars are higher most likely due to her increased stress due to the problems with her house and also eating more food over the summer.  However, they are still within range, so for now, I advised her to keep Januvia at hand, but not started quite yet. - I suggested to:  Patient Instructions  Please continue: - Metformin ER 1000 mg 2x a day  If sugars increase >> may need to add back Januvia.  Please return in 4 months with your sugar log.    - today, HbA1c is 6.8% (higher) - continue checking sugars at different times of the day - check 1x a day, rotating checks - advised for yearly eye exams >> she is not UTD - refuses flu shot today - We will check annual labs today - Return to clinic in 4 mo with sugar log   2. Obesity class 1 - before last visit, she had lost 20 lbs in 3 mo!  She lost 6 more pounds since  last visit! -She continues to be very conscious about her diet and to adjust it.  She did eat more fruit during the summer  3. HL - Reviewed latest lipid panel from 02/2017: all fractions at goal Lab Results  Component Value Date   CHOL 115 02/27/2017   HDL 55.30 02/27/2017   LDLCALC 52 02/27/2017   TRIG 36.0 02/27/2017   CHOLHDL 2 02/27/2017  - Continues Lipitor without side effects. - she is due for another lipid panel >> we will check today  Component     Latest Ref Rng & Units 03/31/2018  Glucose     65 - 99 mg/dL 92  BUN     7 - 25 mg/dL 13  Creatinine     0.50 - 1.05 mg/dL 0.60  GFR, Est Non African American     > OR = 60 mL/min/1.36m 103  GFR, Est African American     > OR = 60 mL/min/1.774m120  BUN/Creatinine Ratio     6 - 22 (calc) NOT APPLICABLE  Sodium     13295 146 mmol/L 141  Potassium     3.5 - 5.3 mmol/L 3.5  Chloride     98 - 110 mmol/L 105  CO2     20 - 32 mmol/L 25  Calcium     8.6 - 10.4 mg/dL 8.8  Total Protein     6.1 - 8.1 g/dL 6.5  Albumin MSPROF     3.6 - 5.1 g/dL 3.9  Globulin     1.9 - 3.7 g/dL (calc) 2.6  AG Ratio     1.0 - 2.5 (calc) 1.5  Total Bilirubin     0.2 - 1.2 mg/dL 0.7  Alkaline phosphatase (APISO)     33 - 130 U/L 55  AST     10 - 35 U/L 16  ALT     6 - 29 U/L 13  Cholesterol     0 - 200 mg/dL 104  Triglycerides     0.0 - 149.0 mg/dL 42.0  HDL Cholesterol     >  39.00 mg/dL 60.70  VLDL     0.0 - 40.0 mg/dL 8.4  LDL (calc)     0 - 99 mg/dL 34  Total CHOL/HDL Ratio      2  NonHDL      42.88  Microalb, Ur     0.0 - 1.9 mg/dL 2.3 (H)  Creatinine,U     mg/dL 135.8  MICROALB/CREAT RATIO     0.0 - 30.0 mg/g 1.7   Labs are excellent.  Philemon Kingdom, MD PhD [facility]

## 2018-03-31 NOTE — Patient Instructions (Addendum)
Please continue: - Metformin ER 1000 mg 2x a day  If sugars increase >> may need to add back Januvia.  Please return in 4 months with your sugar log.

## 2018-04-15 ENCOUNTER — Other Ambulatory Visit: Payer: Self-pay | Admitting: Internal Medicine

## 2018-04-15 DIAGNOSIS — IMO0002 Reserved for concepts with insufficient information to code with codable children: Secondary | ICD-10-CM

## 2018-04-15 DIAGNOSIS — E1165 Type 2 diabetes mellitus with hyperglycemia: Secondary | ICD-10-CM

## 2018-04-15 DIAGNOSIS — E118 Type 2 diabetes mellitus with unspecified complications: Principal | ICD-10-CM

## 2018-04-29 DIAGNOSIS — Z471 Aftercare following joint replacement surgery: Secondary | ICD-10-CM | POA: Diagnosis not present

## 2018-04-29 DIAGNOSIS — Z96652 Presence of left artificial knee joint: Secondary | ICD-10-CM | POA: Diagnosis not present

## 2018-07-26 ENCOUNTER — Other Ambulatory Visit: Payer: Self-pay | Admitting: Internal Medicine

## 2018-07-26 DIAGNOSIS — IMO0002 Reserved for concepts with insufficient information to code with codable children: Secondary | ICD-10-CM

## 2018-07-26 DIAGNOSIS — E1165 Type 2 diabetes mellitus with hyperglycemia: Secondary | ICD-10-CM

## 2018-07-26 DIAGNOSIS — E118 Type 2 diabetes mellitus with unspecified complications: Principal | ICD-10-CM

## 2018-08-05 ENCOUNTER — Ambulatory Visit: Payer: 59 | Admitting: Internal Medicine

## 2018-10-24 ENCOUNTER — Other Ambulatory Visit: Payer: Self-pay | Admitting: Internal Medicine

## 2018-10-24 DIAGNOSIS — E1165 Type 2 diabetes mellitus with hyperglycemia: Secondary | ICD-10-CM

## 2018-10-24 DIAGNOSIS — IMO0002 Reserved for concepts with insufficient information to code with codable children: Secondary | ICD-10-CM

## 2018-10-24 DIAGNOSIS — E118 Type 2 diabetes mellitus with unspecified complications: Principal | ICD-10-CM

## 2018-12-12 ENCOUNTER — Other Ambulatory Visit: Payer: Self-pay | Admitting: Internal Medicine

## 2018-12-12 DIAGNOSIS — E1165 Type 2 diabetes mellitus with hyperglycemia: Secondary | ICD-10-CM

## 2018-12-12 DIAGNOSIS — IMO0002 Reserved for concepts with insufficient information to code with codable children: Secondary | ICD-10-CM

## 2019-02-14 ENCOUNTER — Other Ambulatory Visit: Payer: Self-pay | Admitting: Internal Medicine

## 2019-02-14 DIAGNOSIS — E1165 Type 2 diabetes mellitus with hyperglycemia: Secondary | ICD-10-CM

## 2019-02-14 DIAGNOSIS — IMO0002 Reserved for concepts with insufficient information to code with codable children: Secondary | ICD-10-CM

## 2019-02-15 NOTE — Telephone Encounter (Signed)
Last office visit 03/31/18  Cancel/No-show none  Future office visit scheduled? no

## 2019-02-15 NOTE — Telephone Encounter (Signed)
Due to Covid situation, OK to refill but needs a appt within the next 3 mo. This can be virtual.

## 2019-05-13 ENCOUNTER — Other Ambulatory Visit: Payer: Self-pay | Admitting: Internal Medicine

## 2019-05-13 DIAGNOSIS — IMO0002 Reserved for concepts with insufficient information to code with codable children: Secondary | ICD-10-CM

## 2019-05-13 DIAGNOSIS — E1165 Type 2 diabetes mellitus with hyperglycemia: Secondary | ICD-10-CM

## 2019-06-03 ENCOUNTER — Other Ambulatory Visit: Payer: Self-pay

## 2019-06-04 ENCOUNTER — Encounter: Payer: Self-pay | Admitting: Internal Medicine

## 2019-06-04 ENCOUNTER — Ambulatory Visit: Payer: 59 | Admitting: Internal Medicine

## 2019-06-04 VITALS — BP 130/90 | HR 94 | Ht 68.5 in | Wt 260.0 lb

## 2019-06-04 DIAGNOSIS — IMO0002 Reserved for concepts with insufficient information to code with codable children: Secondary | ICD-10-CM

## 2019-06-04 DIAGNOSIS — E118 Type 2 diabetes mellitus with unspecified complications: Secondary | ICD-10-CM

## 2019-06-04 DIAGNOSIS — E1165 Type 2 diabetes mellitus with hyperglycemia: Secondary | ICD-10-CM | POA: Diagnosis not present

## 2019-06-04 DIAGNOSIS — E785 Hyperlipidemia, unspecified: Secondary | ICD-10-CM

## 2019-06-04 DIAGNOSIS — E669 Obesity, unspecified: Secondary | ICD-10-CM

## 2019-06-04 LAB — POCT GLYCOSYLATED HEMOGLOBIN (HGB A1C): Hemoglobin A1C: 9.4 % — AB (ref 4.0–5.6)

## 2019-06-04 MED ORDER — METFORMIN HCL ER 500 MG PO TB24: 1000.0000 mg | ORAL_TABLET | Freq: Two times a day (BID) | ORAL | 3 refills | Status: DC

## 2019-06-04 MED ORDER — FARXIGA 5 MG PO TABS: 5.0000 mg | ORAL_TABLET | Freq: Every day | ORAL | 5 refills | Status: DC

## 2019-06-04 NOTE — Progress Notes (Signed)
Patient ID: Diana Burns, female   DOB: [birth year], 58 y.o.   MRN: [id number]   This visit occurred during the SARS-CoV-2 public health emergency.  Safety protocols were in place, including screening questions prior to the visit, additional usage of staff PPE, and extensive cleaning of exam room while observing appropriate contact time as indicated for disinfecting solutions.   HPI: Diana Burns is a 58 y.o.-year-old female, initially referred by her PCP, Dr. Everlene Farrier , returning for follow-up for DM2, dx in 10/2015, non-insulin-dependent, uncontrolled, without long term complications.  Last visit 1 year and 2 months ago.  She is not compliant with visits.  Since 2019, she joined the gym and started to work on improving her diet by including mostly fruit and veggies, poultry, no fried foods, less bread.  She has done well and her sugars and HbA1c levels improved.  However, latest HbA1c is from 03/2018.  Now relaxed diet and no exercise - she is homeless >> sugars and weight increased. She has been off Metformin for 2 weeks during the summer, now stretching it: 1000 mg daily.  Reviewed history: Pt was dx'ed with DM after an injury to her finger >> ED >> labs showed a Glu in the 300. Before diagnosis, She had a period of few months of increased stress  - very busy, many deaths among family and close friends. She was eating once a day, sleeping 2-3 hours a day - working days and nights. She also had steroid inj in knee (she will need to have total knee replacement), Prednisone for a tooth abscess, and Prednisone for laryngitis.  Reviewed HbA1c levels: Lab Results  Component Value Date   HGBA1C 6.8 (A) 03/31/2018   HGBA1C 6.6 08/11/2017   HGBA1C 6.9 (H) 05/20/2017   Pt is on a regimen of:  - Metformin ER 1000 mg in am She was previously on Januvia.  Pt checks her sugars 0 to once a day - no log, no meter: - am:  73, 100-120 >> 81-110s >> n/c >> last 2 weeks: 91-100 - 2h after b'fast: n/c >>  128-143 >> n/c - before lunch: n/c  - 2h after lunch: n/c >> 139 >> n/c - before dinner: 103-110 >> 100-106 >> n/c - 2h after dinner: n/c >> 117-170 >> 150-180 >> 185-210 (eating late) - bedtime: n/c - nighttime: n/c Lowest sugar was 73 >> 81 >> 96 >> 78, recently 91.  It is unclear at which level she has hypoglycemia awareness. Highest sugar was 170 >> 118 >> 212 (b'day party) >> 210.  Glucometer: One Touch  Pt's meals are: - Breakfast: Eggs, salmon, sausage, but mostly protein shake - Lunch: salad or skips - Dinner: salad or sandwich, burger, seafood - Snacks: 0-2, unsalted peanuts, pistachios  -No CKD, last BUN/creatinine:  Lab Results  Component Value Date   BUN 13 03/31/2018   BUN 8 05/24/2017   CREATININE 0.60 03/31/2018   CREATININE 0.58 05/24/2017   -+ HL; last set of lipids: Lab Results  Component Value Date   CHOL 104 03/31/2018   HDL 60.70 03/31/2018   LDLCALC 34 03/31/2018   TRIG 42.0 03/31/2018   CHOLHDL 2 03/31/2018  Off Lipitor - ran out. - last eye exam was in 11/2018: No DR -No numbness and tingling in her feet.  She had TKR in 04/2018 and a revision in 08/2017.   ROS: Constitutional: + weight gain/no weight loss, no fatigue, no subjective hyperthermia, no subjective hypothermia, + insomnia (sleeps 3h a night now -  stressed) Eyes: no blurry vision, no xerophthalmia ENT: no sore throat, no nodules palpated in neck, no dysphagia, no odynophagia, no hoarseness Cardiovascular: no CP/no SOB/no palpitations/no leg swelling Respiratory: no cough/no SOB/no wheezing Gastrointestinal: no N/no V/no D/no C/no acid reflux Musculoskeletal: no muscle aches/no joint aches Skin: no rashes, no hair loss Neurological: no tremors/no numbness/no tingling/no dizziness  I reviewed pt's medications, allergies, PMH, social hx, family hx, and changes were documented in the history of present illness. Otherwise, unchanged from my initial visit note.  Past Medical History:   Diagnosis Date  . Allergy   . Anemia   . Arthritis   . Diabetes mellitus without complication [facility])    Past Surgical History:  Procedure Laterality Date  . KNEE SURGERY    . 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 Marital status: Single    Spouse name: N/A  . Number of children: 0   Occupational History  . Machine Garment/textile technologist    Social History Main Topics  . Smoking status: Never Smoker  . Smokeless tobacco: Not on file  . Alcohol use Yes - wine and mixed drinks rarely   . Drug use: No   Social History Narrative   Raised between [place] and [place].   Current Outpatient Medications on File Prior to Visit  Medication Sig Dispense Refill  . atorvastatin (LIPITOR) 20 MG tablet TAKE 1 TABLET BY MOUTH EVERY DAY 90 tablet 3  . blood glucose meter kit and supplies KIT Dispense based on patient and insurance preference. Use up to four times daily as directed. (FOR ICD-9 250.00, 250.01). 1 each 0  . metFORMIN (GLUCOPHAGE-XR) 500 MG 24 hr tablet Take 2 tablets (1,000 mg total) by mouth 2 (two) times daily. Need appointment for refills 360 tablet 0  . Multiple Vitamin (MULTIVITAMIN WITH MINERALS) TABS tablet Take 1 tablet daily by mouth.     No current facility-administered medications on file prior to visit.   Allergies  Allergen Reactions  . Aspartame And Phenylalanine Other (See Comments)    Artificial sweetners - headache  . Bee Venom Swelling  . Latex Hives and Itching  . Phenylalanine Other (See Comments)    Artificial sweetners - headache  . Pollen Extract    Family History  Problem Relation Age of Onset  . Cancer Mother   . Heart disease Mother   . Hyperlipidemia Mother   . Hypertension Mother    PE: BP 130/90   Pulse 94   Ht 5' 8.5" (1.74 m)   Wt 260 lb (117.9 kg)    SpO2 97%   BMI 38.96 kg/m  Wt Readings from Last 3 Encounters:  06/04/19 260 lb (117.9 kg)  03/31/18 225 lb (102.1 kg)  08/11/17 231 lb 12.8 oz (105.1 kg)   Constitutional: overweight, in NAD Eyes: PERRLA, EOMI, no exophthalmos ENT: moist mucous membranes, + thyromegaly, no cervical lymphadenopathy Cardiovascular: tachycardia, RR, No MRG Respiratory: CTA B Gastrointestinal: abdomen soft, NT, ND, BS+ Musculoskeletal: no deformities, strength intact in all 4 Skin: moist, warm, no rashes Neurological: no tremor with outstretched hands, DTR normal in all 4  ASSESSMENT: 1. DM2, non-insulin-dependent, uncontrolled, without long term complications but with hyperglycemia  2. Obesity class II BMI Classification:  < 18.5 underweight   18.5-24.9 normal weight   25.0-29.9 overweight   30.0-34.9 class I obesity  35.0-39.9 class II obesity   ? 40.0 class III obesity   3. HL  PLAN:  1. Patient with history of controlled diabetes, with improving blood sugars after she started to improve her diet and also started to exercise.  She also started to take her medicines as prescribed.  Since 2018 she lost approximately 25 pounds before last visit and was feeling much better.  She ran out of Januvia in the past and did not have to restart as her sugars were excellent.  At last visit, sugars were higher most likely due to increased stress and also eating more fruit over the summer.  However, they were still within range so we did not change her regimen at last visit but I did advise her to let me know if her sugars worsened to restart Januvia. - At this visit, she tells me that just regimen of Metformin, only drinking half of the recommended dose.  Sugars are higher in the evening, but they appear to be at goal in the morning, increased recently.  She had higher sugars during Thanksgiving. -She is determined to get back on track with her diet and exercise, but for now, I suggested to increase the  Metformin ER dose and also to start Farxiga low-dose.  I advised her to stay well-hydrated while on this.  Discussed about benefits and possible side effects.  At next visit, we will need to recheck her kidney function and potassium level. - I suggested to:  Patient Instructions  Please increase: - Metformin ER 1000 mg 2x a day with b'fast and dinner  Please start: - Farxiga 5 mg before b'fast  Please return in 3-4 months with your sugar log.   - we checked her HbA1c: 9.4% (much higher) - advised to check sugars at different times of the day - 1x a day, rotating check times - advised for yearly eye exams >> she is UTD - check annual labs today - return to clinic in 3-4 months  2. Obesity class II -Before last visit, she lost 25 pounds, now gained 35 lbs back! -She will restart working on her diet and exercise -We will continue Metformin which is appetite reducing and weight stabilizing long-term -we will increase the dose now -We will also start an SGLT2 inhibitor which should also help with weight loss  3. HL -Reviewed latest lipid panel from 03/2018: At goal Lab Results  Component Value Date   CHOL 104 03/31/2018   HDL 60.70 03/31/2018   LDLCALC 34 03/31/2018   TRIG 42.0 03/31/2018   CHOLHDL 2 03/31/2018  -She is off Lipitor as she ran out, but in the past she tolerated this without side effects. -We will check another lipid panel today - had an apple today  Addendum - labs still pending.  Philemon Kingdom, MD PhD [facility] Endocrinology

## 2019-06-04 NOTE — Patient Instructions (Addendum)
Please increase: - Metformin ER 1000 mg 2x a day with b'fast and dinner  Please start: - Farxiga 5 mg before b'fast  Please return in 3-4 months with your sugar log.

## 2019-09-03 ENCOUNTER — Other Ambulatory Visit: Payer: Self-pay

## 2019-09-06 NOTE — Progress Notes (Addendum)
Patient ID: Diana Burns, female   DOB: 1961-02-24, 59 y.o.   MRN: [id number]   This visit occurred during the SARS-CoV-2 public health emergency.  Safety protocols were in place, including screening questions prior to the visit, additional usage of staff PPE, and extensive cleaning of exam room while observing appropriate contact time as indicated for disinfecting solutions.   HPI: Diana Burns is a 59 y.o.-year-old female, initially referred by her PCP, Dr. Everlene Farrier , returning for follow-up for DM2, dx in 10/2015, non-insulin-dependent, uncontrolled, without long term complications.  Last visit 3 months ago.  At last visit, she was practically homeless and with a poor diet and no exercise.  Sugars were much higher.  Reviewed history: Pt was dx'ed with DM after an injury to her finger >> ED >> labs showed a Glu in the 300. Before diagnosis, She had a period of few months of increased stress  - very busy, many deaths among family and close friends. She was eating once a day, sleeping 2-3 hours a day - working days and nights. She also had steroid inj in knee (she will need to have total knee replacement), Prednisone for a tooth abscess, and Prednisone for laryngitis.  Reviewed HbA1c levels: Lab Results  Component Value Date   HGBA1C 9.4 (A) 06/04/2019   HGBA1C 6.8 (A) 03/31/2018   HGBA1C 6.6 08/11/2017   Pt is on a regimen of: - Metformin ER 1000 mg in am >> 1000 mg 2x a day She did not start Farxiga 5 mg before breakfast as suggested 05/2019. She was previously on Januvia.  Pt checks her sugars 1x a day: - am:  73, 100-120 >> 81-110s >> n/c >> 91-100 >> 90s-130 - 2h after b'fast: n/c >> 128-143 >> n/c - before lunch: n/c  - 2h after lunch: n/c >> 139 >> n/c - before dinner: 103-110 >> 100-106 >> n/c >> 83-140 - 2h after dinner: 117-170 >> 150-180 >> 185-210 >> n/c - bedtime: n/c >> 120s-148 - nighttime: n/c Lowest sugar was 78, recently 91 >> 83.  It is unclear at which level she has  hypoglycemia awareness. Highest sugar was 212 (b'day party) >> 210 >> 148.  Glucometer: One Touch  Pt's meals are more plant based now + seafood, less chicken and beef.  -No CKD, last BUN/creatinine:  Lab Results  Component Value Date   BUN 13 03/31/2018   BUN 8 05/24/2017   CREATININE 0.60 03/31/2018   CREATININE 0.58 05/24/2017   -+ HL; last set of lipids: Lab Results  Component Value Date   CHOL 104 03/31/2018   HDL 60.70 03/31/2018   LDLCALC 34 03/31/2018   TRIG 42.0 03/31/2018   CHOLHDL 2 03/31/2018  On Lipitor - last eye exam was in 11/2018: No DR - no numbness and tingling in her feet.  She had TKR in 04/2018 and a revision in 08/2017.   She has a history of thyromegaly and 3 small thyroid nodules per thyroid ultrasound from 2008: Clinical Data: Thyromegaly on physical exam.   THYROID ULTRASOUND:  Technique: Ultrasound examination of the thyroid gland and adjacent soft tissue structures was performed.  Comparison: [place] CT Chest report, 09/18/01.  Findings: The thyroid gland is upper limits of normal in size to slightly enlarged with the right lobe measuring 5.2 cm long x 1.7 cm AP x 2.6 cm wide and left lobe 5.1 cm long x 1.5 cm AP x 2.2 cm wide with slight thickness at the midline isthmus measuring 5 mm. Thyroid  echo texture is diffusely inhomogeneous.  Three nodules are seen within the thyroid gland:  The largest at the posterior mid right is solid measuring 1 cm long x 0.7 cm AP x 0.7 cm wide.   Similar posterior lower pole left lobe solid nodule measures 0.7 cm long x 0.5 cm AP x 0.6 cm wide.   A tiny 2 mm cyst is seen at the medial superior right lobe.   IMPRESSION:  Heterogeneous borderline diffusely enlarged thyroid gland with three discrete probable adenomas. Differential diagnosis includes subtle multinodular goiter and chronic Hashimoto's thyroiditis.      Pt denies: - feeling nodules in neck - hoarseness - dysphagia - choking - SOB with lying  down  TSH reviewed: Lab Results  Component Value Date   TSH 0.49 11/27/2015   TSH 1.305 05/03/2014   ROS: Constitutional: no weight gain/no weight loss, no fatigue, no subjective hyperthermia, no subjective hypothermia Eyes: no blurry vision, no xerophthalmia ENT: no sore throat, + see HPI Cardiovascular: no CP/no SOB/no palpitations/no leg swelling Respiratory: no cough/no SOB/no wheezing Gastrointestinal: no N/no V/no D/no C/no acid reflux Musculoskeletal: no muscle aches/no joint aches Skin: no rashes, no hair loss Neurological: no tremors/no numbness/no tingling/no dizziness  I reviewed pt's medications, allergies, PMH, social hx, family hx, and changes were documented in the history of present illness. Otherwise, unchanged from my initial visit note.  Past Medical History:  Diagnosis Date  . Allergy   . Anemia   . Arthritis   . Diabetes mellitus without complication [facility])    Past Surgical History:  Procedure Laterality Date  . KNEE SURGERY    . 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 Marital status: Single    Spouse name: N/A  . Number of children: 0   Occupational History  . Machine Garment/textile technologist    Social History Main Topics  . Smoking status: Never Smoker  . Smokeless tobacco: Not on file  . Alcohol use Yes - wine and mixed drinks rarely   . Drug use: No   Social History Narrative   Raised between [place] and [place].   Current Outpatient Medications on File Prior to Visit  Medication Sig Dispense Refill  . atorvastatin (LIPITOR) 20 MG tablet TAKE 1 TABLET BY MOUTH EVERY DAY 90 tablet 3  . blood glucose meter kit and supplies KIT Dispense based on patient and insurance preference. Use up to four times daily as directed. (FOR ICD-9 250.00, 250.01). 1 each 0  .  dapagliflozin propanediol (FARXIGA) 5 MG TABS tablet Take 5 mg by mouth daily. 30 tablet 5  . metFORMIN (GLUCOPHAGE-XR) 500 MG 24 hr tablet Take 2 tablets (1,000 mg total) by mouth 2 (two) times daily. Need appointment for refills 360 tablet 3  . Multiple Vitamin (MULTIVITAMIN WITH MINERALS) TABS tablet Take 1 tablet daily by mouth.     No current facility-administered medications on file prior to visit.   Allergies  Allergen Reactions  . Aspartame And Phenylalanine Other (See Comments)    Artificial sweetners - headache  . Bee Venom Swelling  . Latex Hives and Itching  . Phenylalanine Other (See Comments)    Artificial sweetners - headache  . Pollen Extract    Family History  Problem Relation Age of Onset  . Cancer Mother   . Heart disease Mother   .  Hyperlipidemia Mother   . Hypertension Mother    PE: BP 120/70   Pulse 84   Ht 5' 8.5" (1.74 m)   Wt 248 lb (112.5 kg)   SpO2 98%   BMI 37.16 kg/m  Wt Readings from Last 3 Encounters:  09/07/19 248 lb (112.5 kg)  06/04/19 260 lb (117.9 kg)  03/31/18 225 lb (102.1 kg)   Constitutional: overweight, in NAD Eyes: PERRLA, EOMI, no exophthalmos ENT: moist mucous membranes, + thyromegaly, no cervical lymphadenopathy Cardiovascular: RRR, No MRG Respiratory: CTA B Gastrointestinal: abdomen soft, NT, ND, BS+ Musculoskeletal: no deformities, strength intact in all 4 Skin: moist, warm, no rashes Neurological: no tremor with outstretched hands, DTR normal in all 4  ASSESSMENT: 1. DM2, non-insulin-dependent, uncontrolled, without long term complications but with hyperglycemia  2. Obesity class II BMI Classification:  < 18.5 underweight   18.5-24.9 normal weight   25.0-29.9 overweight   30.0-34.9 class I obesity   35.0-39.9 class II obesity   ? 40.0 class III obesity   3. HL  4. Thyromegaly  PLAN:  1. Patient with history of controlled diabetes, with improving blood sugars after she started to improve her diet and  also started to exercise.  She also started to take her medicines as prescribed.  Since 2018 until the beginning of last year, she lost approximately 25 pounds and was feeling much better.  However, during the pandemic, she relaxed her diet and stopped exercising.  She gained 35 pounds in the year prior to our last visit.  I suggested to add [place] >> but did not start as she wanted to improve her diet.  She was also planning to restart exercise. -At this visit, sugars are better after she started to improve her diet.  We discussed about further changes in her diet to reduce fatty foods.  I also discussed with her that we can start Wilder Glade now, since sugars are still above target, but she would like to continue with dietary changes for now.  If sugars not better at next visit, we can add the SGLT 2 inhibitor then.  She does have it at home. - I suggested to:   Patient Instructions  Please continue: - Metformin ER 1000 mg 2x a day with b'fast and dinner  Please return in 3-4 months with your sugar log.   - we checked her HbA1c: 7.9% (better) - advised to check sugars at different times of the day - 1x a day, rotating check times - advised for yearly eye exams >> she is UTD - return to clinic in 3-4 months  2. Obesity class II -Continue Metformin which is appetite reducing - lost 12 lbs since last OV  3. HL -Review latest lipid panel from 03/2018: At goal Lab Results  Component Value Date   CHOL 104 03/31/2018   HDL 60.70 03/31/2018   LDLCALC 34 03/31/2018   TRIG 42.0 03/31/2018   CHOLHDL 2 03/31/2018  -At last visit she ran out of Lipitor and we restarted it  4. Thyromegaly - on palpation - no neck compression sxs - per review of the Thyroid U/S from 2008: heterogeneous gland, borderline enlarged, with 3 small nodules - recheck U/S now - We will also check a new TSH, along with free T4, free T3  Component     Latest Ref Rng & Units 09/07/2019  Sodium     135 - 145 mEq/L 137   Potassium     3.5 - 5.1 mEq/L 3.6  Chloride  96 - 112 mEq/L 104  CO2     19 - 32 mEq/L 25  Glucose     70 - 99 mg/dL 110 (H)  BUN     6 - 23 mg/dL 22  Creatinine     0.40 - 1.20 mg/dL 0.55  Total Bilirubin     0.2 - 1.2 mg/dL 0.5  Alkaline Phosphatase     39 - 117 U/L 54  AST     0 - 37 U/L 24  ALT     0 - 35 U/L 26  Total Protein     6.0 - 8.3 g/dL 7.4  Albumin     3.5 - 5.2 g/dL 4.0  GFR     >60.00 mL/min 138.15  Calcium     8.4 - 10.5 mg/dL 9.5  Cholesterol     0 - 200 mg/dL 104  Triglycerides     0.0 - 149.0 mg/dL 41.0  HDL Cholesterol     >39.00 mg/dL 54.50  VLDL     0.0 - 40.0 mg/dL 8.2  LDL (calc)     0 - 99 mg/dL 41  Total CHOL/HDL Ratio      2  NonHDL      49.12  Microalb, Ur     0.0 - 1.9 mg/dL 1.6  Creatinine,U     mg/dL 80.3  MICROALB/CREAT RATIO     0.0 - 30.0 mg/g 2.0  TSH     0.35 - 4.50 uIU/mL 1.41  T4,Free(Direct)     0.60 - 1.60 ng/dL 0.78  Triiodothyronine,Free,Serum     2.3 - 4.2 pg/mL 3.2   Normal TFTs.  Thyroid ultrasound pending.   Thyroid U/S (09/10/2019): Parenchymal Echotexture: Mildly heterogenous Isthmus: 0.6 cm thickness, previously 0.5 Right lobe: 5.6 x 1.9 x 2.2 cm, previously 5.2 x 1.7 x 2.6 Left lobe: 5.5 x 2.5 x 2.6 cm, previously 5.1 x 1.5 x 2.2 _________________________________________________________  Nodule # 1: Location: Isthmus; Maximum size: 1.1 cm; Other 2 dimensions: 0.6 x 0.6 cm Composition: solid/almost completely solid (2) Echogenicity: hypoechoic (2) *Given size (>/= 1 - 1.4 cm) and appearance, a follow-up ultrasound in 1 year should be considered based on TI-RADS criteria. _________________________________________________________  Nodule # 2: 0.9 cm hypoechoic mid right, previously 1 cm; stability for greater than 5 years implies benignity; This nodule does NOT meet TI-RADS criteria for biopsy or dedicated follow-up.  Nodule # 3: Location: Left; Mid Maximum size: 1.7 cm; Other 2  dimensions: 1.5 x 1.7 cm Composition: mixed cystic and solid (1) Echogenicity: hypoechoic (2) *Given size (>/= 1.5 - 2.4 cm) and appearance, a follow-up ultrasound in 1 year should be considered based on TI-RADS criteria. ________________________________________________________  Nodule # 4: Location: Left; Inferior Maximum size: 1.3 cm; Other 2 dimensions: 0.8 x 0.9 cm Composition: solid/almost completely solid (2) Echogenicity: hypoechoic (2) *Given size (>/= 1 - 1.4 cm) and appearance, a follow-up ultrasound in 1 year should be considered based on TI-RADS criteria.  IMPRESSION: 1. Thyromegaly with bilateral nodules. None meets criteria for biopsy. 2. Recommend annual/biennial ultrasound follow-up of nodules as above, until stability x5 years confirmed.  The above is in keeping with the ACR TI-RADS recommendations - J Am Coll Radiol 2017;14:587-595.   Electronically Signed   By: Lucrezia Europe M.D.   On: 09/10/2019 08:28      Small nodules, will follow them with another ultrasound in a year.  Philemon Kingdom, MD PhD [facility] Endocrinology

## 2019-09-06 NOTE — Patient Instructions (Addendum)
Please continue: - Metformin ER 1000 mg 2x a day with b'fast and dinner  Please stop at the lab.  Please return in 3-4 months with your sugar log.

## 2019-09-07 ENCOUNTER — Encounter: Payer: Self-pay | Admitting: Internal Medicine

## 2019-09-07 ENCOUNTER — Other Ambulatory Visit: Payer: Self-pay

## 2019-09-07 ENCOUNTER — Ambulatory Visit: Payer: 59 | Admitting: Internal Medicine

## 2019-09-07 VITALS — BP 120/70 | HR 84 | Ht 68.5 in | Wt 248.0 lb

## 2019-09-07 DIAGNOSIS — E669 Obesity, unspecified: Secondary | ICD-10-CM | POA: Diagnosis not present

## 2019-09-07 DIAGNOSIS — E118 Type 2 diabetes mellitus with unspecified complications: Secondary | ICD-10-CM | POA: Diagnosis not present

## 2019-09-07 DIAGNOSIS — E785 Hyperlipidemia, unspecified: Secondary | ICD-10-CM

## 2019-09-07 DIAGNOSIS — E010 Iodine-deficiency related diffuse (endemic) goiter: Secondary | ICD-10-CM

## 2019-09-07 DIAGNOSIS — IMO0002 Reserved for concepts with insufficient information to code with codable children: Secondary | ICD-10-CM

## 2019-09-07 DIAGNOSIS — E1165 Type 2 diabetes mellitus with hyperglycemia: Secondary | ICD-10-CM

## 2019-09-07 LAB — COMPREHENSIVE METABOLIC PANEL
ALT: 26 U/L (ref 0–35)
AST: 24 U/L (ref 0–37)
Albumin: 4 g/dL (ref 3.5–5.2)
Alkaline Phosphatase: 54 U/L (ref 39–117)
BUN: 22 mg/dL (ref 6–23)
CO2: 25 mEq/L (ref 19–32)
Calcium: 9.5 mg/dL (ref 8.4–10.5)
Chloride: 104 mEq/L (ref 96–112)
Creatinine, Ser: 0.55 mg/dL (ref 0.40–1.20)
GFR: 138.15 mL/min (ref >60.00)
Glucose, Bld: 110 mg/dL — ABNORMAL HIGH (ref 70–99)
Potassium: 3.6 mEq/L (ref 3.5–5.1)
Sodium: 137 mEq/L (ref 135–145)
Total Bilirubin: 0.5 mg/dL (ref 0.2–1.2)
Total Protein: 7.4 g/dL (ref 6.0–8.3)

## 2019-09-07 LAB — LIPID PANEL
Cholesterol: 104 mg/dL (ref 0–200)
HDL: 54.5 mg/dL (ref >39.00)
LDL Cholesterol: 41 mg/dL (ref 0–99)
NonHDL: 49.12
Total CHOL/HDL Ratio: 2
Triglycerides: 41 mg/dL (ref 0.0–149.0)
VLDL: 8.2 mg/dL (ref 0.0–40.0)

## 2019-09-07 LAB — POCT GLYCOSYLATED HEMOGLOBIN (HGB A1C): Hemoglobin A1C: 7.9 % — AB (ref 4.0–5.6)

## 2019-09-07 LAB — T3, FREE: T3, Free: 3.2 pg/mL (ref 2.3–4.2)

## 2019-09-07 LAB — T4, FREE: Free T4: 0.78 ng/dL (ref 0.60–1.60)

## 2019-09-07 LAB — MICROALBUMIN / CREATININE URINE RATIO
Creatinine,U: 80.3 mg/dL
Microalb Creat Ratio: 2 mg/g (ref 0.0–30.0)
Microalb, Ur: 1.6 mg/dL (ref 0.0–1.9)

## 2019-09-07 LAB — TSH: TSH: 1.41 u[IU]/mL (ref 0.35–4.50)

## 2019-09-07 MED ORDER — METFORMIN HCL ER 500 MG PO TB24: 1000.0000 mg | ORAL_TABLET | Freq: Two times a day (BID) | ORAL | 3 refills | Status: DC

## 2019-09-09 ENCOUNTER — Ambulatory Visit
Admission: RE | Admit: 2019-09-09 | Discharge: 2019-09-09 | Disposition: A | Discharge status: Home / Self Care | Payer: 59 | Source: Ambulatory Visit | Attending: Internal Medicine | Admitting: Internal Medicine

## 2019-09-09 DIAGNOSIS — E010 Iodine-deficiency related diffuse (endemic) goiter: Secondary | ICD-10-CM

## 2020-01-14 ENCOUNTER — Ambulatory Visit: Payer: 59 | Admitting: Internal Medicine

## 2020-01-14 ENCOUNTER — Other Ambulatory Visit: Payer: Self-pay

## 2020-01-14 ENCOUNTER — Encounter: Payer: Self-pay | Admitting: Internal Medicine

## 2020-01-14 VITALS — BP 120/78 | HR 64 | Ht 68.5 in | Wt 238.0 lb

## 2020-01-14 DIAGNOSIS — E785 Hyperlipidemia, unspecified: Secondary | ICD-10-CM

## 2020-01-14 DIAGNOSIS — E1165 Type 2 diabetes mellitus with hyperglycemia: Secondary | ICD-10-CM | POA: Diagnosis not present

## 2020-01-14 DIAGNOSIS — E669 Obesity, unspecified: Secondary | ICD-10-CM | POA: Diagnosis not present

## 2020-01-14 LAB — POCT GLYCOSYLATED HEMOGLOBIN (HGB A1C): Hemoglobin A1C: 6.3 % — AB (ref 4.0–5.6)

## 2020-01-14 NOTE — Progress Notes (Signed)
Patient ID: Diana Burns, female   DOB: 18-Apr-1961, 59 y.o.   MRN: [id number]   This visit occurred during the SARS-CoV-2 public health emergency.  Safety protocols were in place, including screening questions prior to the visit, additional usage of staff PPE, and extensive cleaning of exam room while observing appropriate contact time as indicated for disinfecting solutions.   HPI: Diana Burns is a 59 y.o.-year-old female, initially referred by her PCP, Dr. Everlene Farrier , returning for follow-up for DM2, dx in 10/2015, non-insulin-dependent, uncontrolled, without long term complications.  Last visit 4 mo ago.  She is now on a diet with fruit/veggies + meat + Juice Plus fiber supplement. She cut out: carbs, gluten, added sugars, dairy, salt. She also counts her steps >> aiming for 10,000 steps a day.  Reviewed hx: Pt was dx'ed with DM after an injury to her finger >> ED >> labs showed a Glu in the 300. Before diagnosis, She had a period of few months of increased stress  - very busy, many deaths among family and close friends. She was eating once a day, sleeping 2-3 hours a day - working days and nights. She also had steroid inj in knee (she will need to have total knee replacement), Prednisone for a tooth abscess, and Prednisone for laryngitis.  Reviewed HbA1c levels: Lab Results  Component Value Date   HGBA1C 7.9 (A) 09/07/2019   HGBA1C 9.4 (A) 06/04/2019   HGBA1C 6.8 (A) 03/31/2018   Pt is on a regimen of: - Metformin ER 1000 mg in am >> 1000 mg 2x a day She did not start Farxiga 5 mg before breakfast as suggested 05/2019. She was previously on Januvia.  Pt is not checking sugars except 1-2x recently: - am: 81-110s >> n/c >> 91-100 >> 90s-130 >> 70 - 2h after b'fast: n/c >> 128-143 >> n/c  - before lunch: n/c  - 2h after lunch: n/c >> 139 >> n/c - before dinner: 100-106 >> n/c >> 83-140 >> n/c - 2h after dinner:  150-180 >> 185-210 >> n/c - bedtime: n/c >> 120s-148 - nighttime:  n/c Lowest sugar was 78, recently 91 >> 83 >> 70.  It is unclear at which level she has hypoglycemia awareness. Highest sugar was 212 (b'day party) >> 210 >> 148 >> ?Marland Kitchen  Glucometer: One Touch  -No CKD, last BUN/creatinine:  Lab Results  Component Value Date   BUN 22 09/07/2019   BUN 13 03/31/2018   CREATININE 0.55 09/07/2019   CREATININE 0.60 03/31/2018   -+ HL; last set of lipids: Lab Results  Component Value Date   CHOL 104 09/07/2019   HDL 54.50 09/07/2019   LDLCALC 41 09/07/2019   TRIG 41.0 09/07/2019   CHOLHDL 2 09/07/2019  On Lipitor - last eye exam was in 11/2018: No DR - no numbness and tingling in her feet.  She had TKR in 04/2018 and a revision in 08/2017.   She has a history of thyromegaly and 3 small thyroid nodules per thyroid ultrasound from 2008: Clinical Data: Thyromegaly on physical exam.   THYROID ULTRASOUND:  Technique: Ultrasound examination of the thyroid gland and adjacent soft tissue structures was performed.  Comparison: [place] CT Chest report, 09/18/01.  Findings: The thyroid gland is upper limits of normal in size to slightly enlarged with the right lobe measuring 5.2 cm long x 1.7 cm AP x 2.6 cm wide and left lobe 5.1 cm long x 1.5 cm AP x 2.2 cm wide with slight thickness at the  midline isthmus measuring 5 mm. Thyroid echo texture is diffusely inhomogeneous.  Three nodules are seen within the thyroid gland:  The largest at the posterior mid right is solid measuring 1 cm long x 0.7 cm AP x 0.7 cm wide.   Similar posterior lower pole left lobe solid nodule measures 0.7 cm long x 0.5 cm AP x 0.6 cm wide.   A tiny 2 mm cyst is seen at the medial superior right lobe.   IMPRESSION:  Heterogeneous borderline diffusely enlarged thyroid gland with three discrete probable adenomas. Differential diagnosis includes subtle multinodular goiter and chronic Hashimoto's thyroiditis.      Reviewed latest thyroid U/S report: Thyroid U/S (09/10/2019): Parenchymal  Echotexture: Mildly heterogenous Isthmus: 0.6 cm thickness, previously 0.5 Right lobe: 5.6 x 1.9 x 2.2 cm, previously 5.2 x 1.7 x 2.6 Left lobe: 5.5 x 2.5 x 2.6 cm, previously 5.1 x 1.5 x 2.2 _________________________________________________________  Nodule # 1: Location: Isthmus; Maximum size: 1.1 cm; Other 2 dimensions: 0.6 x 0.6 cm Composition: solid/almost completely solid (2) Echogenicity: hypoechoic (2) *Given size (>/= 1 - 1.4 cm) and appearance, a follow-up ultrasound in 1 year should be considered based on TI-RADS criteria.  Nodule # 2: 0.9 cm hypoechoic mid right, previously 1 cm; stability for greater than 5 years implies benignity; This nodule does NOT meet TI-RADS criteria for biopsy or dedicated follow-up.  Nodule # 3: Location: Left; Mid Maximum size: 1.7 cm; Other 2 dimensions: 1.5 x 1.7 cm Composition: mixed cystic and solid (1) Echogenicity: hypoechoic (2) *Given size (>/= 1.5 - 2.4 cm) and appearance, a follow-up ultrasound in 1 year should be considered based on TI-RADS criteria. ________________________________________________________  Nodule # 4: Location: Left; Inferior Maximum size: 1.3 cm; Other 2 dimensions: 0.8 x 0.9 cm Composition: solid/almost completely solid (2) Echogenicity: hypoechoic (2) *Given size (>/= 1 - 1.4 cm) and appearance, a follow-up ultrasound in 1 year should be considered based on TI-RADS criteria.  IMPRESSION: 1. Thyromegaly with bilateral nodules. None meets criteria for biopsy. 2. Recommend annual/biennial ultrasound follow-up of nodules as above, until stability x5 years confirmed.  Pt denies: - feeling nodules in neck - hoarseness - dysphagia - choking - SOB with lying down  TSH reviewed: Lab Results  Component Value Date   TSH 1.41 09/07/2019   TSH 0.49 11/27/2015   TSH 1.305 05/03/2014   ROS: Constitutional: no weight gain/+ weight loss, no fatigue, no subjective hyperthermia, no subjective  hypothermia Eyes: no blurry vision, no xerophthalmia ENT: no sore throat, + see HPI Cardiovascular: no CP/no SOB/no palpitations/no leg swelling Respiratory: no cough/no SOB/no wheezing Gastrointestinal: no N/no V/no D/no C/no acid reflux Musculoskeletal: no muscle aches/no joint aches Skin: no rashes, no hair loss Neurological: no tremors/no numbness/no tingling/no dizziness  I reviewed pt's medications, allergies, PMH, social hx, family hx, and changes were documented in the history of present illness. Otherwise, unchanged from my initial visit note.  Past Medical History:  Diagnosis Date  . Allergy   . Anemia   . Arthritis   . Diabetes mellitus without complication [facility])    Past Surgical History:  Procedure Laterality Date  . KNEE SURGERY    . 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 Marital status: Single    Spouse name: N/A  . Number of children: 0   Occupational History  .  Machine Garment/textile technologist    Social History Main Topics  . Smoking status: Never Smoker  . Smokeless tobacco: Not on file  . Alcohol use Yes - wine and mixed drinks rarely   . Drug use: No   Social History Narrative   Raised between [place] and [place].   Current Outpatient Medications on File Prior to Visit  Medication Sig Dispense Refill  . atorvastatin (LIPITOR) 20 MG tablet TAKE 1 TABLET BY MOUTH EVERY DAY 90 tablet 3  . blood glucose meter kit and supplies KIT Dispense based on patient and insurance preference. Use up to four times daily as directed. (FOR ICD-9 250.00, 250.01). 1 each 0  . metFORMIN (GLUCOPHAGE-XR) 500 MG 24 hr tablet Take 2 tablets (1,000 mg total) by mouth 2 (two) times daily. 360 tablet 3  . Multiple Vitamin (MULTIVITAMIN WITH MINERALS) TABS tablet Take 1 tablet daily by mouth.     No  current facility-administered medications on file prior to visit.   Allergies  Allergen Reactions  . Aspartame And Phenylalanine Other (See Comments)    Artificial sweetners - headache  . Bee Venom Swelling  . Latex Hives and Itching  . Phenylalanine Other (See Comments)    Artificial sweetners - headache  . Pollen Extract    Family History  Problem Relation Age of Onset  . Cancer Mother   . Heart disease Mother   . Hyperlipidemia Mother   . Hypertension Mother    PE: BP 120/78   Pulse 64   Ht 5' 8.5" (1.74 m)   Wt (!) 238 lb (108 kg)   SpO2 99%   BMI 35.66 kg/m  Wt Readings from Last 3 Encounters:  01/14/20 (!) 238 lb (108 kg)  09/07/19 248 lb (112.5 kg)  06/04/19 260 lb (117.9 kg)   Constitutional: overweight, in NAD Eyes: PERRLA, EOMI, no exophthalmos ENT: moist mucous membranes, no thyromegaly, no cervical lymphadenopathy Cardiovascular: RRR, No MRG Respiratory: CTA B Gastrointestinal: abdomen soft, NT, ND, BS+ Musculoskeletal: no deformities, strength intact in all 4 Skin: moist, warm, no rashes Neurological: no tremor with outstretched hands, DTR normal in all 4  ASSESSMENT: 1. DM2, non-insulin-dependent, uncontrolled, without long term complications but with hyperglycemia  2. Obesity class II BMI Classification:  < 18.5 underweight   18.5-24.9 normal weight   25.0-29.9 overweight   30.0-34.9 class I obesity   35.0-39.9 class II obesity   ? 40.0 class III obesity   3. HL  4.  Multiple thyroid nodules  PLAN:  1. Patient with history of controlled diabetes, with improved blood sugars after she started to improve her diet, started to exercise, and take her medicines as prescribed.  However, during the pandemic, she relaxed her diet and stopped exercises.  She gained weight (approximately 35 pounds).  At last visit, sugars started to improve after she again started to improve her diet and we discussed about further changes in her diet to reduce fatty  foods.  I suggested Wilder Glade but she wanted to continue to work on her diet and may be added at today's visit, if still needed. -At this visit, she is not checking blood sugars and I strongly advised her to start. We checked her HbA1c: 6.3% (much better) -Since last visit, she changed her diet significantly and she is now mostly on a plant-based diet with occasional lean meat.  She is avoiding carbs, gluten, salt, artificial sweeteners.  She is also active and trying to walk to build up to 10,000 steps a day.  I congratulated her and strongly advised her to continue.  We do not need to change her regimen at this visit.  We may need to reduce Metformin at next visit. - I suggested to:   Patient Instructions  Please continue: - Metformin ER 1000 mg 2x a day with b'fast and dinner  Please return in 3-4 months with your sugar log.   - advised to check sugars at different times of the day - 1x a day, rotating check times - advised for yearly eye exams >> she is not UTD - return to clinic in 3-4 months  2.  Obesity class II -We will continue Metformin ER, which has an appetite suppressant effect -lost 10 lbs since last OV!  3. HL -Reviewed latest lipid panel from 08/2019: All fractions at goal: Lab Results  Component Value Date   CHOL 104 09/07/2019   HDL 54.50 09/07/2019   LDLCALC 41 09/07/2019   TRIG 41.0 09/07/2019   CHOLHDL 2 09/07/2019  -She continues on Lipitor without side effects  4.  Multiple thyroid nodules -She has thyromegaly on palpation -She denies neck compression symptoms -The thyroid ultrasound from 2008 showed a heterogeneous gland, borderline enlarged, with 3 small nodules.  We repeated the ultrasound on 09/10/2019 and there were no worrisome nodules.  I reviewed the results with her today.  We will need to repeat the ultrasound in a year. -Reviewed TFTs from 08/2019 and they were normal  Philemon Kingdom, MD PhD [facility]

## 2020-01-14 NOTE — Patient Instructions (Addendum)
Please continue: - Metformin ER 1000 mg 2x a day with b'fast and dinner  Start checking sugars 1x a day, rotating check times.  KEEP UP THE GREAT WORK!  Please return in 4 months with your sugar log.

## 2020-01-16 ENCOUNTER — Other Ambulatory Visit: Payer: Self-pay | Admitting: Internal Medicine

## 2020-01-16 DIAGNOSIS — E1165 Type 2 diabetes mellitus with hyperglycemia: Secondary | ICD-10-CM

## 2020-01-16 DIAGNOSIS — IMO0002 Reserved for concepts with insufficient information to code with codable children: Secondary | ICD-10-CM

## 2020-05-17 ENCOUNTER — Ambulatory Visit: Payer: 59 | Admitting: Internal Medicine

## 2020-07-16 ENCOUNTER — Other Ambulatory Visit: Payer: Self-pay | Admitting: Internal Medicine

## 2020-07-16 DIAGNOSIS — E1165 Type 2 diabetes mellitus with hyperglycemia: Secondary | ICD-10-CM

## 2020-07-16 DIAGNOSIS — IMO0002 Reserved for concepts with insufficient information to code with codable children: Secondary | ICD-10-CM

## 2020-10-11 ENCOUNTER — Other Ambulatory Visit: Payer: Self-pay | Admitting: Internal Medicine

## 2020-10-11 DIAGNOSIS — E1165 Type 2 diabetes mellitus with hyperglycemia: Secondary | ICD-10-CM

## 2020-10-11 DIAGNOSIS — IMO0002 Reserved for concepts with insufficient information to code with codable children: Secondary | ICD-10-CM

## 2020-10-12 ENCOUNTER — Other Ambulatory Visit: Payer: Self-pay | Admitting: Internal Medicine

## 2020-10-12 DIAGNOSIS — IMO0002 Reserved for concepts with insufficient information to code with codable children: Secondary | ICD-10-CM

## 2020-10-12 DIAGNOSIS — E1165 Type 2 diabetes mellitus with hyperglycemia: Secondary | ICD-10-CM

## 2020-11-09 ENCOUNTER — Other Ambulatory Visit: Payer: Self-pay | Admitting: Internal Medicine

## 2020-11-09 DIAGNOSIS — IMO0002 Reserved for concepts with insufficient information to code with codable children: Secondary | ICD-10-CM

## 2020-11-09 DIAGNOSIS — E1165 Type 2 diabetes mellitus with hyperglycemia: Secondary | ICD-10-CM

## 2020-11-15 ENCOUNTER — Other Ambulatory Visit: Payer: Self-pay | Admitting: Internal Medicine

## 2020-11-15 ENCOUNTER — Telehealth: Payer: Self-pay | Admitting: Internal Medicine

## 2020-11-15 DIAGNOSIS — E1165 Type 2 diabetes mellitus with hyperglycemia: Secondary | ICD-10-CM

## 2020-11-15 DIAGNOSIS — IMO0002 Reserved for concepts with insufficient information to code with codable children: Secondary | ICD-10-CM

## 2020-11-15 MED ORDER — ATORVASTATIN CALCIUM 20 MG PO TABS: 1.0000 | ORAL_TABLET | Freq: Every day | ORAL | 0 refills | Status: DC

## 2020-11-15 MED ORDER — METFORMIN HCL ER 500 MG PO TB24: 1000.0000 mg | ORAL_TABLET | Freq: Two times a day (BID) | ORAL | 0 refills | Status: DC

## 2020-11-15 NOTE — Telephone Encounter (Signed)
Rx sent to preferred pharmacy.

## 2020-11-15 NOTE — Telephone Encounter (Signed)
Patient made a follow up appointment for 01/04/21 (also on wait list) - patient is requesting a refill on her Metformin & Atorvastatin to the [facility] on [address] until her appointment.

## 2020-12-11 ENCOUNTER — Other Ambulatory Visit: Payer: Self-pay | Admitting: Internal Medicine

## 2020-12-11 DIAGNOSIS — E1165 Type 2 diabetes mellitus with hyperglycemia: Secondary | ICD-10-CM

## 2020-12-11 DIAGNOSIS — IMO0002 Reserved for concepts with insufficient information to code with codable children: Secondary | ICD-10-CM

## 2020-12-15 IMAGING — US US THYROID
1 series · 13 of 25 positions shown · non-contrast
Comparison: 12/18/2006

CLINICAL DATA: Thyromegaly

EXAM:
THYROID ULTRASOUND
TECHNIQUE: Ultrasound examination of the thyroid gland and adjacent soft
tissues was performed.

[Series 1: us thyroid · 0.08mm/px · 13 of 69 slices shown]
[im 1/69]
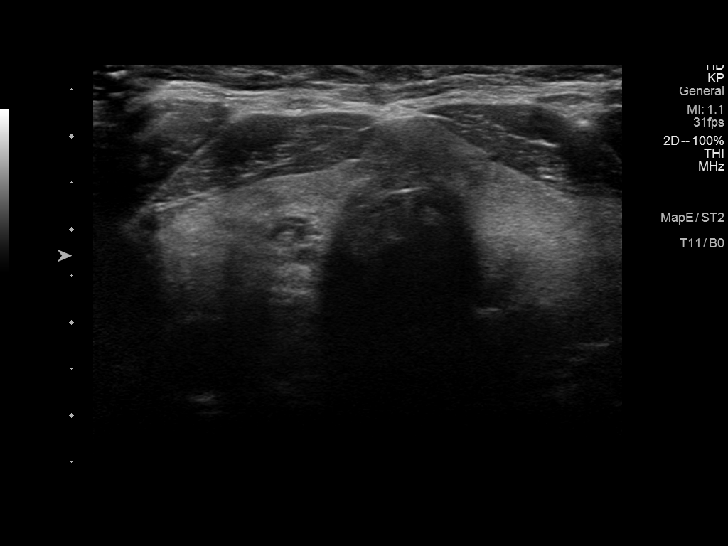
[im 6/69]
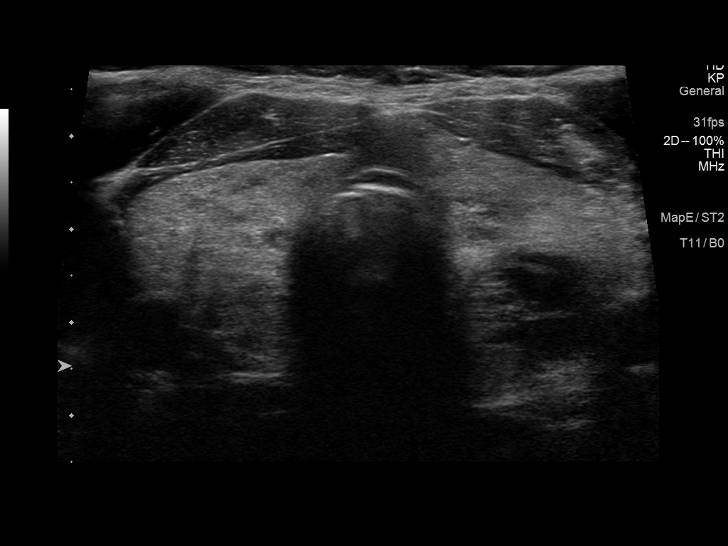
[im 12/69]
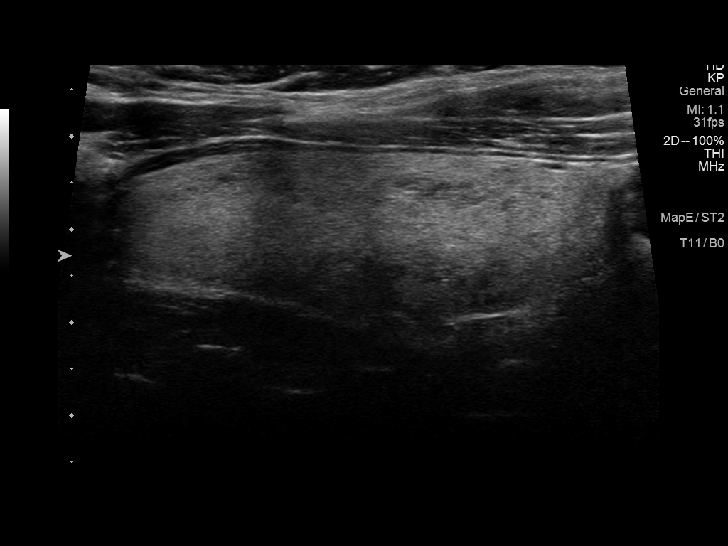
[im 18/69]
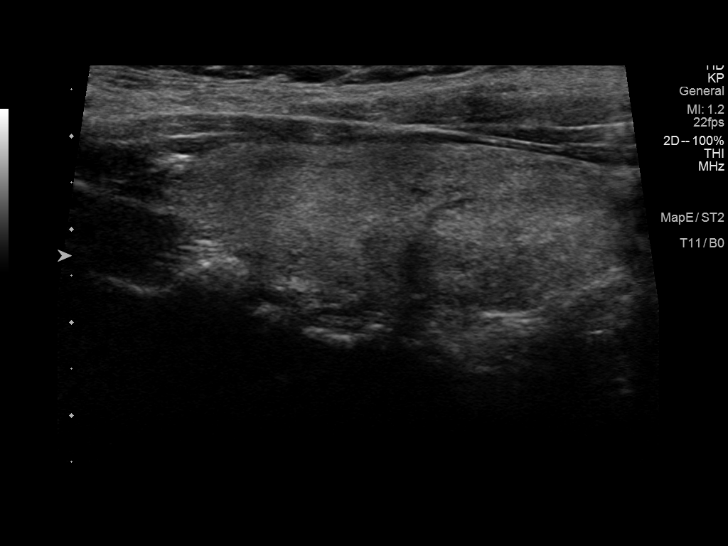
[im 23/69]
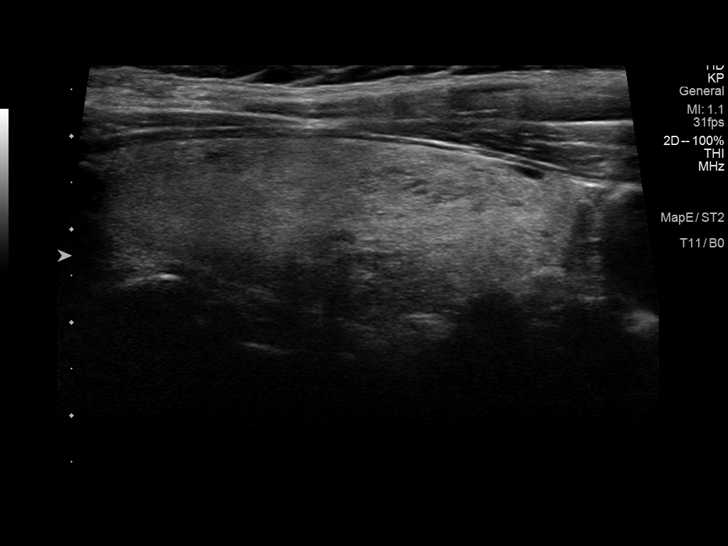
[im 29/69]
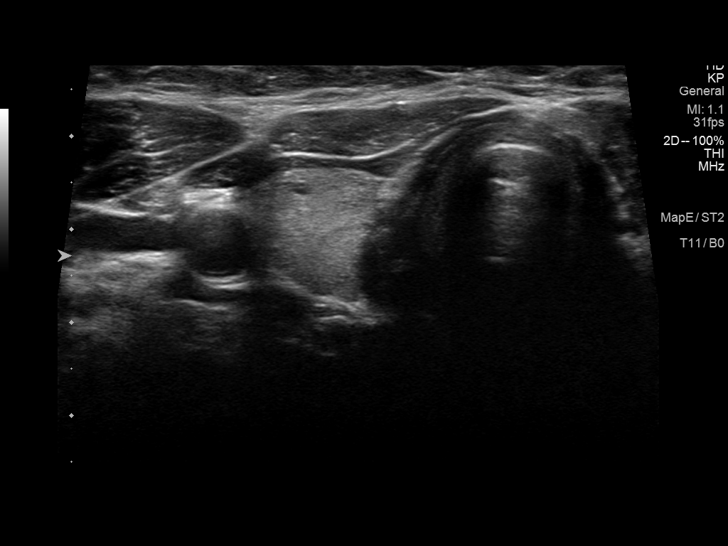
[im 35/69]
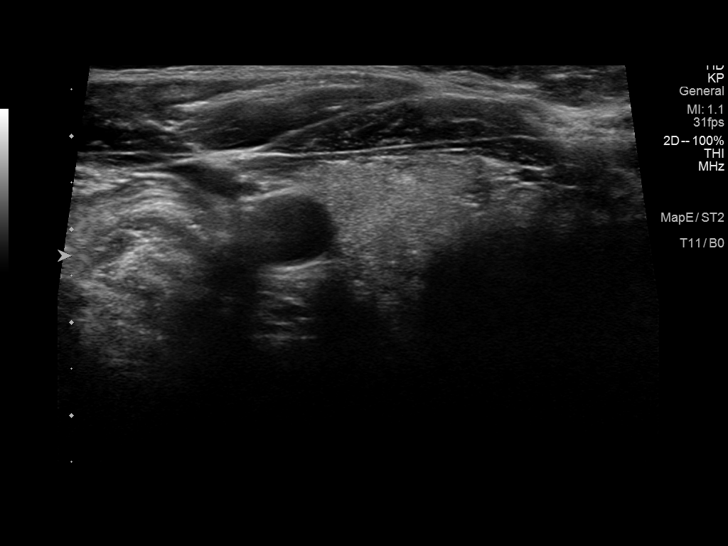
[im 40/69]
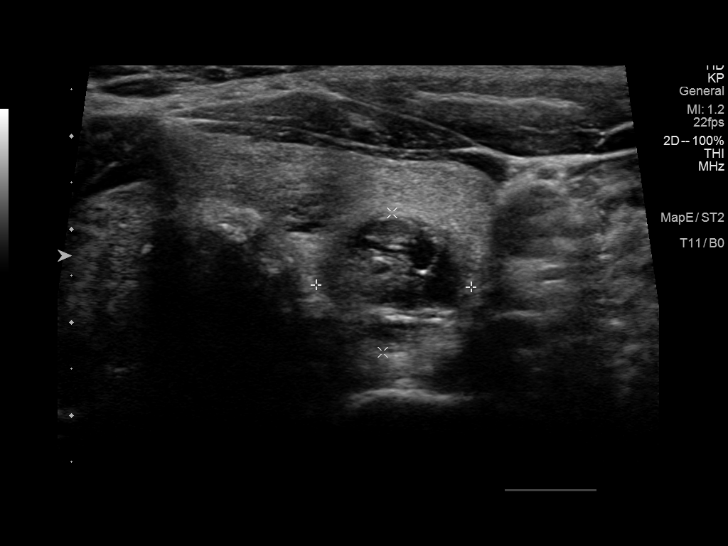
[im 46/69]
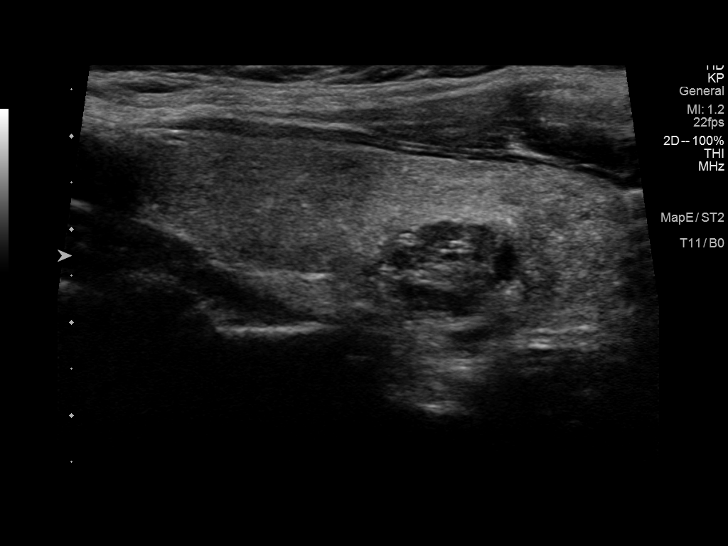
[im 52/69]
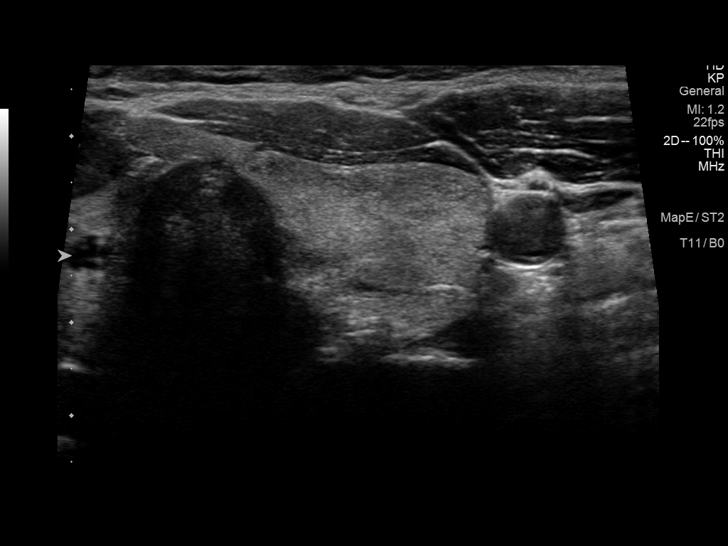
[im 57/69]
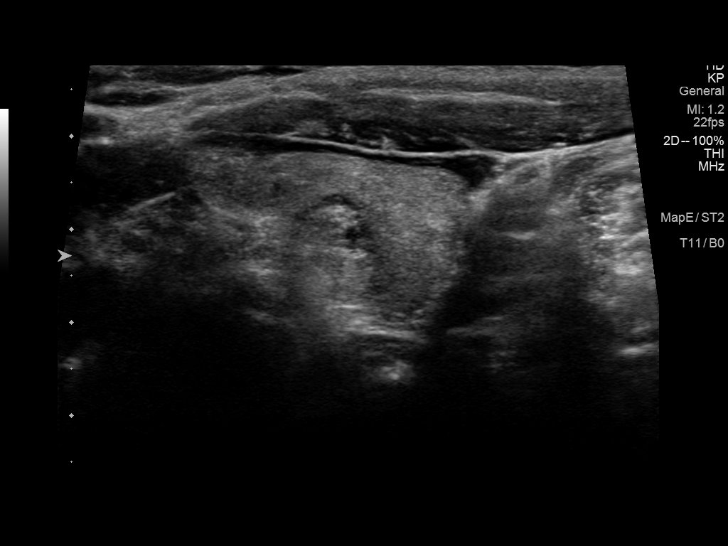
[im 63/69]
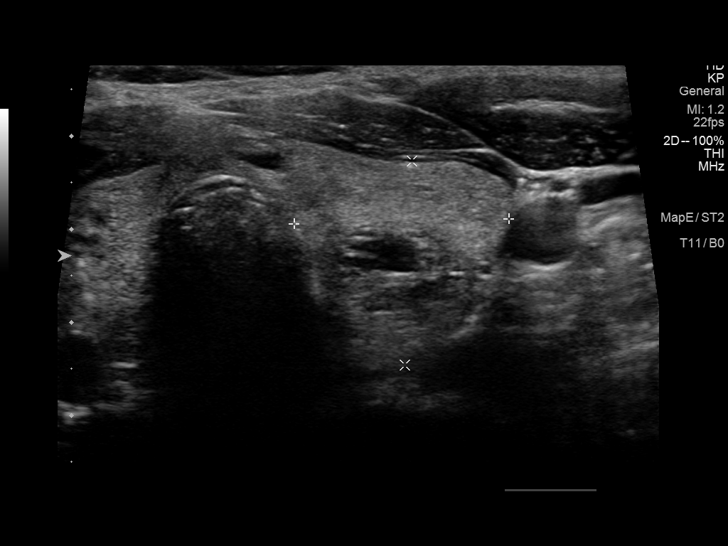
[im 69/69]
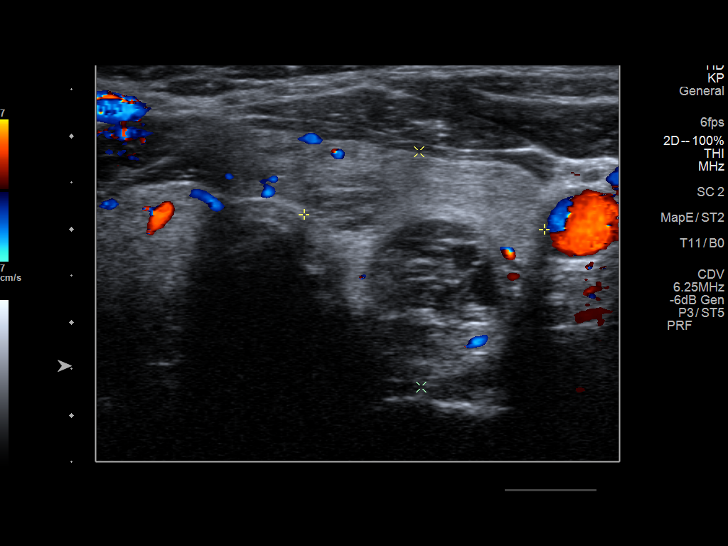

[13 of 25 positions shown; findings below may reference images not displayed]

FINDINGS: Parenchymal Echotexture: Mildly heterogenous

Isthmus: 0.6 cm thickness, previously

Right lobe: 5.6 x 1.9 x 2.2 cm, previously 5.2 x 1.7 x

Left lobe: 5.5 x 2.5 x 2.6 cm, previously 5.1 x 1.5 x

_________________________________________________________

Estimated total number of nodules >/= 1 cm: 3

Number of spongiform nodules >/=  2 cm not described below (TR1): 0

Number of mixed cystic and solid nodules >/= 1.5 cm not described
below (TR2): 0

_________________________________________________________

Nodule # 1:

Location: Isthmus;

Maximum size: 1.1 cm; Other 2 dimensions: 0.6 x 0.6 cm

Composition: solid/almost completely solid (2)

Echogenicity: hypoechoic (2)

Shape: not taller-than-wide (0)

Margins: ill-defined (0)

Echogenic foci: none (0)

ACR TI-RADS total points: 4.

ACR TI-RADS risk category: TR4 (4-6 points).

ACR TI-RADS recommendations:

*Given size (>/= 1 - 1.4 cm) and appearance, a follow-up ultrasound
in 1 year should be considered based on TI-RADS criteria.

_________________________________________________________

Nodule # 2: 0.9 cm hypoechoic mid right, previously 1 cm; stability
for greater than 5 years implies benignity; This nodule does NOT
meet TI-RADS criteria for biopsy or dedicated follow-up.

Nodule # 3:

Location: Left; Mid

Maximum size: 1.7 cm; Other 2 dimensions: 1.5 x 1.7 cm

Composition: mixed cystic and solid (1)

Echogenicity: hypoechoic (2)

Shape: not taller-than-wide (0)

Margins: smooth (0)

Echogenic foci: none (0)

ACR TI-RADS total points: 3.

ACR TI-RADS risk category: TR3 (3 points).

ACR TI-RADS recommendations:

*Given size (>/= 1.5 - 2.4 cm) and appearance, a follow-up
ultrasound in 1 year should be considered based on TI-RADS criteria.

_________________________________________________________

Nodule # 4:

Location: Left; Inferior

Maximum size: 1.3 cm; Other 2 dimensions: 0.8 x 0.9 cm

Composition: solid/almost completely solid (2)

Echogenicity: hypoechoic (2)

Shape: not taller-than-wide (0)

Margins: ill-defined (0)

Echogenic foci: none (0)

ACR TI-RADS total points: 4.

ACR TI-RADS risk category: TR4 (4-6 points).

ACR TI-RADS recommendations:

*Given size (>/= 1 - 1.4 cm) and appearance, a follow-up ultrasound
in 1 year should be considered based on TI-RADS criteria.
IMPRESSION: 1. Thyromegaly with bilateral nodules. None meets criteria for
biopsy.
2. Recommend annual/biennial ultrasound follow-up of nodules as
above, until stability x5 years confirmed.

The above is in keeping with the ACR TI-RADS recommendations - [HOSPITAL] 1705;[DATE].

## 2020-12-29 ENCOUNTER — Other Ambulatory Visit: Payer: Self-pay | Admitting: Internal Medicine

## 2020-12-29 DIAGNOSIS — E1165 Type 2 diabetes mellitus with hyperglycemia: Secondary | ICD-10-CM

## 2020-12-29 DIAGNOSIS — IMO0002 Reserved for concepts with insufficient information to code with codable children: Secondary | ICD-10-CM

## 2021-01-04 ENCOUNTER — Ambulatory Visit: Payer: 59 | Admitting: Internal Medicine

## 2021-01-04 NOTE — Progress Notes (Deleted)
Patient ID: Diana Burns, female   DOB: 1960/12/03, 60 y.o.   MRN: [id number]   This visit occurred during the SARS-CoV-2 public health emergency.  Safety protocols were in place, including screening questions prior to the visit, additional usage of staff PPE, and extensive cleaning of exam room while observing appropriate contact time as indicated for disinfecting solutions.   HPI: Diana Burns is a 60 y.o.-year-old female, initially referred by her PCP, Dr. Everlene Farrier, returning for follow-up for DM2, dx in 10/2015, non-insulin-dependent, uncontrolled, without long term complications.  Last visit 1 year ago.  Interim history: At last visit, she was on a diet and daily more fruits/veggies and meat.  She cut out carbs, gluten, added sugars, dairy, salt.  She was also counting her steps and aiming for 10,000 steps a day.  Reviewed hx: Pt was dx'ed with DM after an injury to her finger >> ED >> labs showed a Glu in the 300. Before diagnosis, She had a period of few months of increased stress  - very busy, many deaths among family and close friends. She was eating once a day, sleeping 2-3 hours a day - working days and nights. She also had steroid inj in knee (she will need to have total knee replacement), Prednisone for a tooth abscess, and Prednisone for laryngitis.  Reviewed HbA1c levels: Lab Results  Component Value Date   HGBA1C 6.3 (A) 01/14/2020   HGBA1C 7.9 (A) 09/07/2019   HGBA1C 9.4 (A) 06/04/2019   Pt is on a regimen of: - Metformin ER 1000 mg in am >> 1000 mg 2x a day She did not start Farxiga 5 mg before breakfast as suggested 05/2019. She was previously on Januvia.  Pt is checking her sugars seldom: - am: 81-110s >> n/c >> 91-100 >> 90s-130 >> 70 - 2h after b'fast: n/c >> 128-143 >> n/c  - before lunch: n/c  - 2h after lunch: n/c >> 139 >> n/c - before dinner: 100-106 >> n/c >> 83-140 >> n/c - 2h after dinner:  150-180 >> 185-210 >> n/c - bedtime: n/c >> 120s-148 - nighttime:  n/c Lowest sugar was 78, recently 91 >> 83 >> 70.  It is unclear at which level she has hypoglycemia awareness. Highest sugar was 212 (b'day party) >> 210 >> 148 >> ?Marland Kitchen  Glucometer: One Touch  -No CKD, last BUN/creatinine:  Lab Results  Component Value Date   BUN 22 09/07/2019   BUN 13 03/31/2018   CREATININE 0.55 09/07/2019   CREATININE 0.60 03/31/2018   -+ HL; last set of lipids: Lab Results  Component Value Date   CHOL 104 09/07/2019   HDL 54.50 09/07/2019   LDLCALC 41 09/07/2019   TRIG 41.0 09/07/2019   CHOLHDL 2 09/07/2019  On Lipitor.  - last eye exam was in 11/2018: No DR.  - no numbness and tingling in her feet.  She had TKR in 04/2018 and a revision in 08/2017.   She has a history of thyromegaly and 3 small thyroid nodules per thyroid ultrasound from 2008: Clinical Data: Thyromegaly on physical exam.   THYROID ULTRASOUND:  Technique: Ultrasound examination of the thyroid gland and adjacent soft tissue structures was performed.  Comparison: [place] CT Chest report, 09/18/01.  Findings: The thyroid gland is upper limits of normal in size to slightly enlarged with the right lobe measuring 5.2 cm long x 1.7 cm AP x 2.6 cm wide and left lobe 5.1 cm long x 1.5 cm AP x 2.2 cm wide with slight  thickness at the midline isthmus measuring 5 mm. Thyroid echo texture is diffusely inhomogeneous.  Three nodules are seen within the thyroid gland: The largest at the posterior mid right is solid measuring 1 cm long x 0.7 cm AP x 0.7 cm wide.  Similar posterior lower pole left lobe solid nodule measures 0.7 cm long x 0.5 cm AP x 0.6 cm wide.  A tiny 2 mm cyst is seen at the medial superior right lobe.   IMPRESSION:  Heterogeneous borderline diffusely enlarged thyroid gland with three discrete probable adenomas. Differential diagnosis includes subtle multinodular goiter and chronic Hashimoto's thyroiditis.      Thyroid U/S (09/10/2019): Parenchymal Echotexture: Mildly  heterogenous Isthmus: 0.6 cm thickness, previously 0.5 Right lobe: 5.6 x 1.9 x 2.2 cm, previously 5.2 x 1.7 x 2.6 Left lobe: 5.5 x 2.5 x 2.6 cm, previously 5.1 x 1.5 x 2.2 _________________________________________________________   Nodule # 1: Location: Isthmus; Maximum size: 1.1 cm; Other 2 dimensions: 0.6 x 0.6 cm Composition: solid/almost completely solid (2) Echogenicity: hypoechoic (2) *Given size (>/= 1 - 1.4 cm) and appearance, a follow-up ultrasound in 1 year should be considered based on TI-RADS criteria.  Nodule # 2: 0.9 cm hypoechoic mid right, previously 1 cm; stability for greater than 5 years implies benignity; This nodule does NOT meet TI-RADS criteria for biopsy or dedicated follow-up.   Nodule # 3: Location: Left; Mid Maximum size: 1.7 cm; Other 2 dimensions: 1.5 x 1.7 cm Composition: mixed cystic and solid (1) Echogenicity: hypoechoic (2) *Given size (>/= 1.5 - 2.4 cm) and appearance, a follow-up ultrasound in 1 year should be considered based on TI-RADS criteria.  ________________________________________________________   Nodule # 4: Location: Left; Inferior Maximum size: 1.3 cm; Other 2 dimensions: 0.8 x 0.9 cm Composition: solid/almost completely solid (2) Echogenicity: hypoechoic (2) *Given size (>/= 1 - 1.4 cm) and appearance, a follow-up ultrasound in 1 year should be considered based on TI-RADS criteria.   IMPRESSION: 1. Thyromegaly with bilateral nodules. None meets criteria for biopsy. 2. Recommend annual/biennial ultrasound follow-up of nodules as above, until stability x5 years confirmed.   Pt denies: - feeling nodules in neck - hoarseness - dysphagia - choking - SOB with lying down  TSH reviewed: Lab Results  Component Value Date   TSH 1.41 09/07/2019   TSH 0.49 11/27/2015   TSH 1.305 05/03/2014   ROS: Constitutional: no weight gain/+ weight loss, no fatigue, no subjective hyperthermia, no subjective hypothermia Eyes: no blurry  vision, no xerophthalmia ENT: no sore throat, + see HPI Cardiovascular: no CP/no SOB/no palpitations/no leg swelling Respiratory: no cough/no SOB/no wheezing Gastrointestinal: no N/no V/no D/no C/no acid reflux Musculoskeletal: no muscle aches/no joint aches Skin: no rashes, no hair loss Neurological: no tremors/no numbness/no tingling/no dizziness  I reviewed pt's medications, allergies, PMH, social hx, family hx, and changes were documented in the history of present illness. Otherwise, unchanged from my initial visit note.  Past Medical History:  Diagnosis Date   Allergy    Anemia    Arthritis    Diabetes mellitus without complication ([place])    Past Surgical History:  Procedure Laterality Date   KNEE SURGERY     TOTAL KNEE ARTHROPLASTY Left 05/23/2017   Procedure: LEFT TOTAL KNEE ARTHROPLASTY;  Surgeon: Sydnee Cabal, MD;  Location: [facility];  Service: Orthopedics;  Laterality: Left;   Social History   Social History   Marital status: Single    Spouse name: N/A   Number of children: 0   Occupational  History   Charity fundraiser    Social History Main Topics   Smoking status: Never Smoker   Smokeless tobacco: Not on file   Alcohol use Yes - wine and mixed drinks rarely    Drug use: No   Social History Narrative   Raised between [place] and [place].   Current Outpatient Medications on File Prior to Visit  Medication Sig Dispense Refill   atorvastatin (LIPITOR) 20 MG tablet Take 1 tablet (20 mg total) by mouth daily. 60 tablet 0   blood glucose meter kit and supplies KIT Dispense based on patient and insurance preference. Use up to four times daily as directed. (FOR ICD-9 250.00, 250.01). 1 each 0   metFORMIN (GLUCOPHAGE-XR) 500 MG 24 hr tablet TAKE 2 TABLETS BY MOUTH TWICE A DAY 120 tablet 1   Multiple Vitamin (MULTIVITAMIN WITH MINERALS) TABS tablet Take 1 tablet daily by mouth.     No current facility-administered medications on file prior to  visit.   Allergies  Allergen Reactions   Aspartame And Phenylalanine Other (See Comments)    Artificial sweetners - headache   Bee Venom Swelling   Latex Hives and Itching   Phenylalanine Other (See Comments)    Artificial sweetners - headache   Pollen Extract    Family History  Problem Relation Age of Onset   Cancer Mother    Heart disease Mother    Hyperlipidemia Mother    Hypertension Mother    PE: There were no vitals taken for this visit. Wt Readings from Last 3 Encounters:  01/14/20 (!) 238 lb (108 kg)  09/07/19 248 lb (112.5 kg)  06/04/19 260 lb (117.9 kg)   Constitutional: overweight, in NAD Eyes: PERRLA, EOMI, no exophthalmos ENT: moist mucous membranes, no thyromegaly, no cervical lymphadenopathy Cardiovascular: RRR, No MRG Respiratory: CTA B Gastrointestinal: abdomen soft, NT, ND, BS+ Musculoskeletal: no deformities, strength intact in all 4 Skin: moist, warm, no rashes Neurological: no tremor with outstretched hands, DTR normal in all 4  ASSESSMENT: 1. DM2, non-insulin-dependent, uncontrolled, without long term complications but with hyperglycemia  2. Obesity class II BMI Classification: < 18.5 underweight  18.5-24.9 normal weight  25.0-29.9 overweight  30.0-34.9 class I obesity  35.0-39.9 class II obesity  ? 40.0 class III obesity   3. HL  4.  Multiple thyroid nodules  PLAN:  1. Patient with   history of controlled diabetes, with improved blood sugars after she started to improve her diet, started to exercise, and take her medicines as prescribed.  However, during the pandemic, she relaxed her diet and stopped exercises.  She gained weight (approximately 35 pounds).  At last visit, sugars started to improve after she again started to improve her diet and we discussed about further changes in her diet to reduce fatty foods.  I suggested Wilder Glade but she wanted to continue to work on her diet and may be added at today's visit, if still needed. -At  this visit, she is not checking blood sugars and I strongly advised her to start. We checked her HbA1c: 6.3% (much better) -Since last visit, she changed her diet significantly and she is now mostly on a plant-based diet with occasional lean meat.  She is avoiding carbs, gluten, salt, artificial sweeteners.  She is also active and trying to walk to build up to 10,000 steps a day.  I congratulated her and strongly advised her to continue.  We do not need to change her regimen at this visit.  We may need  to reduce Metformin at next visit.  - I suggested to:   Patient Instructions  Please continue: - Metformin ER 1000 mg 2x a day with b'fast and dinner  Please return in 6 months with your sugar log.   - we checked her HbA1c: 7%  - advised to check sugars at different times of the day - 1x a day, rotating check times - advised for yearly eye exams >> she is UTD - return to clinic in 6 months  2.  Obesity class II -We will continue metformin ER which has an appetite suppressant effect -She lost 10 pounds before last visit  3. HL -Reviewed latest lipid panel from 08/2019: All fractions at goal: Lab Results  Component Value Date   CHOL 104 09/07/2019   HDL 54.50 09/07/2019   LDLCALC 41 09/07/2019   TRIG 41.0 09/07/2019   CHOLHDL 2 09/07/2019  -She continues Lipitor 20 mg daily without side effects  4.  Multiple thyroid nodules -She has thyromegaly on palpation -No neck compression symptoms -TFTs were normal 08/2019 and we will repeat these today -Thyroid ultrasound from 2008 showed a heterogeneous gland, borderline enlarged, with 3 small nodules.  We repeated the ultrasound in 08/2019 and there were no worrisome nodules.   -We will repeat the ultrasound now  Philemon Kingdom, MD PhD [facility] Endocrinology

## 2021-01-31 ENCOUNTER — Other Ambulatory Visit: Payer: Self-pay | Admitting: Internal Medicine

## 2021-01-31 DIAGNOSIS — IMO0002 Reserved for concepts with insufficient information to code with codable children: Secondary | ICD-10-CM

## 2021-01-31 DIAGNOSIS — E1165 Type 2 diabetes mellitus with hyperglycemia: Secondary | ICD-10-CM

## 2021-02-02 ENCOUNTER — Telehealth: Payer: Self-pay | Admitting: Internal Medicine

## 2021-02-02 NOTE — Telephone Encounter (Signed)
Patient called and would like to come in before her scheduled 03/27/21 appointment for lbas.  Wanted to know if Dr Elvera Lennox can put in labs for her. Also wanted to make sure we saw her RX request from 01/31/21

## 2021-02-05 NOTE — Telephone Encounter (Signed)
Pt contacted via Mychart message. 

## 2021-03-04 ENCOUNTER — Other Ambulatory Visit: Payer: Self-pay | Admitting: Internal Medicine

## 2021-03-04 DIAGNOSIS — IMO0002 Reserved for concepts with insufficient information to code with codable children: Secondary | ICD-10-CM

## 2021-03-04 DIAGNOSIS — E1165 Type 2 diabetes mellitus with hyperglycemia: Secondary | ICD-10-CM

## 2021-03-27 ENCOUNTER — Other Ambulatory Visit: Payer: Self-pay

## 2021-03-27 ENCOUNTER — Ambulatory Visit: Payer: 59 | Admitting: Internal Medicine

## 2021-03-27 ENCOUNTER — Encounter: Payer: Self-pay | Admitting: Internal Medicine

## 2021-03-27 VITALS — BP 142/82 | HR 65 | Ht 68.0 in | Wt 239.2 lb

## 2021-03-27 DIAGNOSIS — E669 Obesity, unspecified: Secondary | ICD-10-CM | POA: Diagnosis not present

## 2021-03-27 DIAGNOSIS — E1165 Type 2 diabetes mellitus with hyperglycemia: Secondary | ICD-10-CM | POA: Diagnosis not present

## 2021-03-27 DIAGNOSIS — E785 Hyperlipidemia, unspecified: Secondary | ICD-10-CM

## 2021-03-27 DIAGNOSIS — E042 Nontoxic multinodular goiter: Secondary | ICD-10-CM | POA: Diagnosis not present

## 2021-03-27 LAB — POCT GLYCOSYLATED HEMOGLOBIN (HGB A1C): Hemoglobin A1C: 6.1 % — AB (ref 4.0–5.6)

## 2021-03-27 MED ORDER — ATORVASTATIN CALCIUM 20 MG PO TABS: 20.0000 mg | ORAL_TABLET | Freq: Every day | ORAL | 3 refills | Status: AC

## 2021-03-27 MED ORDER — METFORMIN HCL ER 500 MG PO TB24: 1000.0000 mg | ORAL_TABLET | Freq: Two times a day (BID) | ORAL | 3 refills | Status: DC

## 2021-03-27 NOTE — Progress Notes (Addendum)
Patient ID: Diana Burns, female   DOB: 1961-04-13, 60 y.o.   MRN: [id number]   This visit occurred during the SARS-CoV-2 public health emergency.  Safety protocols were in place, including screening questions prior to the visit, additional usage of staff PPE, and extensive cleaning of exam room while observing appropriate contact time as indicated for disinfecting solutions.   HPI: Diana Burns is a 60 y.o.-year-old female, initially referred by her PCP, Dr. Everlene Farrier , returning for follow-up for DM2, dx in 10/2015, non-insulin-dependent, uncontrolled, without long term complications.  Last visit 1 year and 2 mo ago.  Interim history: Before last visit, she started a diet with fruit/veggies + meat + Juice Plus fiber supplement. She cut out: carbs, gluten, added sugars, dairy, salt.  She was also trying to build up to 10,000 steps a day.  At this visit, she tells me that she is trying to continue with the daily step target. She mentions that she ate more fruit over the summer. No increased urination, blurry vision, nausea, chest pain. She got stung by ants in 01/2021. She was on steroids.  She did not check sugars around that time.  Reviewed hx: Pt was dx'ed with DM after an injury to her finger >> ED >> labs showed a Glu in the 300. Before diagnosis, She had a period of few months of increased stress  - very busy, many deaths among family and close friends. She was eating once a day, sleeping 2-3 hours a day - working days and nights. She also had steroid inj in knee (she will need to have total knee replacement), Prednisone for a tooth abscess, and Prednisone for laryngitis.  Reviewed HbA1c levels: Lab Results  Component Value Date   HGBA1C 6.3 (A) 01/14/2020   HGBA1C 7.9 (A) 09/07/2019   HGBA1C 9.4 (A) 06/04/2019   Pt is on a regimen of: - Metformin ER 1000 mg in am >> 1000 mg 2x a day She did not start Farxiga 5 mg before breakfast as suggested 05/2019. She was previously on Januvia.  Pt  was checking sugars 0-1x a day- not in last few months! - am: 81-110s >> n/c >> 91-100 >> 90s-130 >> 70 >> n/c - 2h after b'fast: n/c >> 128-143 >> n/c  - before lunch: n/c  - 2h after lunch: n/c >> 139 >> n/c - before dinner: 100-106 >> n/c >> 83-140 >> n/c - 2h after dinner:  150-180 >> 185-210 >> n/c - bedtime: n/c >> 120s-148 >> n/c - nighttime: n/c Lowest sugar was 78, recently 91 >> 83 >> 70 >> 78.  It is unclear at which level she has hypoglycemia awareness. Highest sugar was 212 (b'day party) >> 210 >> 148 >> 187.  Glucometer: One Touch  -No CKD, last BUN/creatinine:  Lab Results  Component Value Date   BUN 22 09/07/2019   BUN 13 03/31/2018   CREATININE 0.55 09/07/2019   CREATININE 0.60 03/31/2018   -+ HL; last set of lipids: Lab Results  Component Value Date   CHOL 104 09/07/2019   HDL 54.50 09/07/2019   LDLCALC 41 09/07/2019   TRIG 41.0 09/07/2019   CHOLHDL 2 09/07/2019  Off Lipitor 20 - stopped "some time ago".  - last eye exam was in 2021: No DR reportedly.  - no numbness and tingling in her feet.  She had TKR in 04/2018 and a revision in 08/2017.   She has a history of thyromegaly and 3 small thyroid nodules per thyroid ultrasound from 2008:  Clinical Data: Thyromegaly on physical exam.   THYROID ULTRASOUND:  Technique: Ultrasound examination of the thyroid gland and adjacent soft tissue structures was performed.  Comparison: [facility] CT Chest report, 09/18/01.  Findings: The thyroid gland is upper limits of normal in size to slightly enlarged with the right lobe measuring 5.2 cm long x 1.7 cm AP x 2.6 cm wide and left lobe 5.1 cm long x 1.5 cm AP x 2.2 cm wide with slight thickness at the midline isthmus measuring 5 mm. Thyroid echo texture is diffusely inhomogeneous.  Three nodules are seen within the thyroid gland: The largest at the posterior mid right is solid measuring 1 cm long x 0.7 cm AP x 0.7 cm wide.  Similar posterior lower pole left lobe solid nodule  measures 0.7 cm long x 0.5 cm AP x 0.6 cm wide.  A tiny 2 mm cyst is seen at the medial superior right lobe.   IMPRESSION:  Heterogeneous borderline diffusely enlarged thyroid gland with three discrete probable adenomas. Differential diagnosis includes subtle multinodular goiter and chronic Hashimoto's thyroiditis.      Reviewed latest thyroid U/S report: Thyroid U/S (09/10/2019): Parenchymal Echotexture: Mildly heterogenous Isthmus: 0.6 cm thickness, previously 0.5 Right lobe: 5.6 x 1.9 x 2.2 cm, previously 5.2 x 1.7 x 2.6 Left lobe: 5.5 x 2.5 x 2.6 cm, previously 5.1 x 1.5 x 2.2 _________________________________________________________   Nodule # 1: Location: Isthmus; Maximum size: 1.1 cm; Other 2 dimensions: 0.6 x 0.6 cm Composition: solid/almost completely solid (2) Echogenicity: hypoechoic (2) *Given size (>/= 1 - 1.4 cm) and appearance, a follow-up ultrasound in 1 year should be considered based on TI-RADS criteria.  Nodule # 2: 0.9 cm hypoechoic mid right, previously 1 cm; stability for greater than 5 years implies benignity; This nodule does NOT meet TI-RADS criteria for biopsy or dedicated follow-up.   Nodule # 3: Location: Left; Mid Maximum size: 1.7 cm; Other 2 dimensions: 1.5 x 1.7 cm Composition: mixed cystic and solid (1) Echogenicity: hypoechoic (2) *Given size (>/= 1.5 - 2.4 cm) and appearance, a follow-up ultrasound in 1 year should be considered based on TI-RADS criteria.  ________________________________________________________   Nodule # 4: Location: Left; Inferior Maximum size: 1.3 cm; Other 2 dimensions: 0.8 x 0.9 cm Composition: solid/almost completely solid (2) Echogenicity: hypoechoic (2) *Given size (>/= 1 - 1.4 cm) and appearance, a follow-up ultrasound in 1 year should be considered based on TI-RADS criteria.   IMPRESSION: 1. Thyromegaly with bilateral nodules. None meets criteria for biopsy. 2. Recommend annual/biennial ultrasound follow-up of  nodules as above, until stability x5 years confirmed.   Pt denies: - feeling nodules in neck - hoarseness - dysphagia - choking - SOB with lying down  TSH reviewed: Lab Results  Component Value Date   TSH 1.41 09/07/2019   TSH 0.49 11/27/2015   TSH 1.305 05/03/2014   ROS: + See HPI  I reviewed pt's medications, allergies, PMH, social hx, family hx, and changes were documented in the history of present illness. Otherwise, unchanged from my initial visit note.  Past Medical History:  Diagnosis Date   Allergy    Anemia    Arthritis    Diabetes mellitus without complication ([place])    Past Surgical History:  Procedure Laterality Date   KNEE SURGERY     TOTAL KNEE ARTHROPLASTY Left 05/23/2017   Procedure: LEFT TOTAL KNEE ARTHROPLASTY;  Surgeon: Sydnee Cabal, MD;  Location: [facility];  Service: Orthopedics;  Laterality: Left;   Social History  Social History   Marital status: Single    Spouse name: N/A   Number of children: 0   Occupational History   Charity fundraiser    Social History Main Topics   Smoking status: Never Smoker   Smokeless tobacco: Not on file   Alcohol use Yes - wine and mixed drinks rarely    Drug use: No   Social History Narrative   Raised between [place] and [place].   Current Outpatient Medications on File Prior to Visit  Medication Sig Dispense Refill   atorvastatin (LIPITOR) 20 MG tablet TAKE 1 TABLET BY MOUTH EVERY DAY 30 tablet 0   blood glucose meter kit and supplies KIT Dispense based on patient and insurance preference. Use up to four times daily as directed. (FOR ICD-9 250.00, 250.01). 1 each 0   metFORMIN (GLUCOPHAGE-XR) 500 MG 24 hr tablet TAKE 2 TABLETS BY MOUTH TWICE A DAY 120 tablet 0   Multiple Vitamin (MULTIVITAMIN WITH MINERALS) TABS tablet Take 1 tablet daily by mouth.     No current facility-administered medications on file prior to visit.   Allergies  Allergen Reactions   Aspartame And Phenylalanine  Other (See Comments)    Artificial sweetners - headache   Bee Venom Swelling   Latex Hives and Itching   Phenylalanine Other (See Comments)    Artificial sweetners - headache   Pollen Extract    Family History  Problem Relation Age of Onset   Cancer Mother    Heart disease Mother    Hyperlipidemia Mother    Hypertension Mother    PE: BP (!) 142/82 (BP Location: Right Arm, Patient Position: Sitting, Cuff Size: Normal)   Pulse 65   Ht '5\' 8"'  (1.727 m)   Wt 239 lb 3.2 oz (108.5 kg)   SpO2 97%   BMI 36.37 kg/m  Wt Readings from Last 3 Encounters:  03/27/21 239 lb 3.2 oz (108.5 kg)  01/14/20 (!) 238 lb (108 kg)  09/07/19 248 lb (112.5 kg)   Constitutional: overweight, in NAD Eyes: PERRLA, EOMI, no exophthalmos ENT: moist mucous membranes, no thyromegaly, no cervical lymphadenopathy Cardiovascular: RRR, No MRG Respiratory: CTA B Gastrointestinal: abdomen soft, NT, ND, BS+ Musculoskeletal: no deformities, strength intact in all 4 Skin: moist, warm, no rashes Neurological: no tremor with outstretched hands, DTR normal in all 4  ASSESSMENT: 1. DM2, non-insulin-dependent, uncontrolled, without long term complications but with hyperglycemia  2. Obesity class II BMI Classification: < 18.5 underweight  18.5-24.9 normal weight  25.0-29.9 overweight  30.0-34.9 class I obesity  35.0-39.9 class II obesity  ? 40.0 class III obesity   3. HL  4.  Multiple thyroid nodules  PLAN:  1. Patient with history of controlled diabetes, with improved blood sugars after starting to improve her diet, started to exercise and take her medicines as prescribed.  However, given the coronavirus pandemic, she relaxed her diet and stopped exercise.  She gained weight (approximately 35 pounds) and sugars worsened.  At last visit, HbA1c started to improve after she restarted to control her diet.  HbA1c at last visit was much better, at 6.3%.  At that time, she was mostly on a plant-based diet which  occasionally needed.  She was avoiding carbs, gluten, salt, artificial sweeteners.  She was also trying to walk to build up to 10,000 steps a day -she continues to try to reach this target.  She also try to continue to stay on the same diet since last visit, but was eating more fruits  over the summer.  I advised her that this is acceptable since this is natural sugar. -However, she was lost to follow-up afterwards and now returns after a year and 2 months. -At today's visit, she is not checking blood sugars and I strongly advised her to restart. -Due to the excellent HbA1c obtained today (see below), we will continue the current regimen for now - I suggested to:   Patient Instructions  Please continue Metformin ER 1000 mg 2x a day.  Restart Atorvastatin 20 mg daily.  Restart checking sugars.   We will need a new Thyroid U/S.  - we checked her HbA1c: 6.1% (lower) - advised to check sugars at different times of the day - 1x a day, rotating check times - advised for yearly eye exams >> she is not UTD - return to clinic in 4 months  2.  Obesity class II -We will continue metformin ER which has an appetite suppressant effect -lost 10 lbs before last visit -Weight is approximately stable at this visit  3. HL -Reviewed latest lipid panel from 08/2019: All fractions at goal Lab Results  Component Value Date   CHOL 104 09/07/2019   HDL 54.50 09/07/2019   LDLCALC 41 09/07/2019   TRIG 41.0 09/07/2019   CHOLHDL 2 09/07/2019  -She was previously on Lipitor 20 mg daily without side effects, but ran out long time ago.  I refilled this today and strongly advised her to resume it. -She is due for another lipid panel-we will check today  4.  Multiple thyroid nodules -She has thyromegaly on palpation but no neck compression symptoms -Reviewed the thyroid ultrasound results from 09/10/2019 which did not show any worrisome nodules.  Previously, a thyroid ultrasound from 2008 showed a heterogeneous  gland, borderline enlarged, with 3 small nodules. -We discussed about repeating her thyroid ultrasound now -Reviewed her TFTs from 08/2019 and they were  normal: Lab Results  Component Value Date   TSH 1.41 09/07/2019  -We will recheck her TSH today  Thyroid U/S (04/30/2021): Parenchymal Echotexture: Mildly heterogenous Isthmus: 0.6 cm, previously 0.6 cm Right lobe: 5.2 x 1.7 x 2.1 cm, previously 5.6 x 1.9 x 2.2 cm Left lobe: 5.6 x 1.8 x 2.5 cm, previously 5.5 x 2.5 x 2.6 cm _____________________________________________________  Previously visualized isthmus nodule is no longer conspicuous.  There is a subcentimeter spongiform nodule in the left superior thyroid, not previously measured but partially visualized on several images. This nodule does not require additional follow-up.  Nodule # 2 (previously labeled 3): Prior biopsy: No Location: Left; Mid Maximum size: 1.0 cm; Other 2 dimensions: 0.8 x 0.8 cm, previously, 1.7 x 1.7 x 1.5 cm Composition: spongiform (0) Echogenicity: isoechoic (1)  Significant change in size (>/= 20% in two dimensions and minimal increase of 2 mm): Yes Change in features: Yes Change in ACR TI-RADS risk category: Yes This nodule does NOT meet TI-RADS criteria for biopsy or dedicated testing. follow-up. ________________________________________________________  Nodule # 3 (previously labeled 4): Prior biopsy: No Location: Left; Inferior Maximum size: 1.6 cm; Other 2 dimensions: 1.2 x 1.0 cm, previously, 1.3 x 0.8 x 0.9 cm Composition: solid/almost completely solid (2) Echogenicity: isoechoic (1) Significant change in size (>/= 20% in two dimensions and minimal increase of 2 mm): Yes Change in features: No *Given size (>/= 1.5 - 2.4 cm) and appearance, a follow-up ultrasound in 1 year should be considered based on TI-RADS criteria. _________________________________________________________  No cervical lymphadenopathy.  IMPRESSION: 1.  Multinodular goiter with waxing and  waning nodule size. 2. Interval enlargement of previously visualized solid nodule in the left inferior thyroid (labeled 3, 1.6 cm, previously labeled 4, 1.3 cm) which meets criteria (TI-RADS category 3) for 1 year ultrasound surveillance. 3. Decreased size of previously visualized spongiform nodule in the left mid thyroid (labeled 2, 1.0 cm, previously labeled 3, 1.7 cm). This nodule is declared benign and does not warrant additional follow-up. 4. The previously visualized isthmus nodule is no longer conspicuous.  Philemon Kingdom, MD PhD [facility] Endocrinology

## 2021-03-27 NOTE — Patient Instructions (Addendum)
Please continue Metformin ER 1000 mg 2x a day.  Restart Atorvastatin 20 mg daily.  Restart checking sugars.   We will need a new Thyroid U/S.

## 2021-04-16 ENCOUNTER — Other Ambulatory Visit: Payer: 59

## 2021-04-30 ENCOUNTER — Ambulatory Visit
Admission: RE | Admit: 2021-04-30 | Discharge: 2021-04-30 | Disposition: A | Discharge status: Home / Self Care | Payer: 59 | Source: Ambulatory Visit | Attending: Internal Medicine | Admitting: Internal Medicine

## 2021-04-30 DIAGNOSIS — E042 Nontoxic multinodular goiter: Secondary | ICD-10-CM

## 2021-07-05 ENCOUNTER — Telehealth: Payer: Self-pay | Admitting: Internal Medicine

## 2021-07-05 DIAGNOSIS — E1165 Type 2 diabetes mellitus with hyperglycemia: Secondary | ICD-10-CM

## 2021-07-05 NOTE — Telephone Encounter (Signed)
MEDICATION: metFORMIN (GLUCOPHAGE-XR) 500 MG 24 hr tablet  PHARMACY:  [facility] on [address], [place], [place]  HAS THE PATIENT CONTACTED THEIR PHARMACY?  Yes-Patient is changing Preferred PHARM to [facility] on [address], [place], [place]  IS THIS A 90 DAY SUPPLY : Yes  IS PATIENT OUT OF MEDICATION: No  IF NOT; HOW MUCH IS LEFT: Approx. 2 weeks  LAST APPOINTMENT DATE: @10 /09/2020  NEXT APPOINTMENT DATE:@4 /08/2021  DO WE HAVE YOUR PERMISSION TO LEAVE A DETAILED MESSAGE?: Yes  OTHER COMMENTS:    **Let patient know to contact pharmacy at the end of the day to make sure medication is ready. **  ** Please notify patient to allow 48-72 hours to process**  **Encourage patient to contact the pharmacy for refills or they can request refills through [facility]**

## 2021-07-06 MED ORDER — METFORMIN HCL ER 500 MG PO TB24: 1000.0000 mg | ORAL_TABLET | Freq: Two times a day (BID) | ORAL | 3 refills | Status: DC

## 2021-07-06 NOTE — Telephone Encounter (Signed)
Rx sent to preferred pharmacy.

## 2021-09-24 ENCOUNTER — Ambulatory Visit: Payer: 59 | Admitting: Internal Medicine

## 2021-09-24 NOTE — Progress Notes (Deleted)
Patient ID: Diana Burns, female   DOB: 01/01/1961, 61 y.o.   MRN: [id number]  ? ?This visit occurred during the SARS-CoV-2 public health emergency.  Safety protocols were in place, including screening questions prior to the visit, additional usage of staff PPE, and extensive cleaning of exam room while observing appropriate contact time as indicated for disinfecting solutions.  ? ?HPI: ?Diana Burns is a 61 y.o.-year-old female, initially referred by her PCP, Dr. Cleta Alberts , returning for follow-up for DM2, dx in 10/2015, non-insulin-dependent, uncontrolled, without long term complications.  Last visit 6 mo ago. ? ?Interim history: ?She continues to try mostly plant-based diet without carbs, gluten, added sugars, dairy, salt.  She was also trying to build up to 10,000 steps a day.  ?No increased urination, blurry vision, nausea, chest pain. ? ?Reviewed hx: ?Pt was dx'ed with DM after an injury to her finger >> ED >> labs showed a Glu in the 300. Before diagnosis, She had a period of few months of increased stress  - very busy, many deaths among family and close friends. She was eating once a day, sleeping 2-3 hours a day - working days and nights. She also had steroid inj in knee (she will need to have total knee replacement), Prednisone for a tooth abscess, and Prednisone for laryngitis. ? ?Reviewed HbA1c levels: ?Lab Results  ?Component Value Date  ? HGBA1C 6.1 (A) 03/27/2021  ? HGBA1C 6.3 (A) 01/14/2020  ? HGBA1C 7.9 (A) 09/07/2019  ? ?Pt is on a regimen of: ?- Metformin ER 1000 mg in am >> 1000 mg 2x a day ?She did not start Farxiga 5 mg before breakfast as suggested 05/2019. ?She was previously on Januvia. ? ?Pt was checking sugars 0-1x a day- not in last few months! ?- am: 81-110s >> n/c >> 91-100 >> 90s-130 >> 70 >> n/c ?- 2h after b'fast: n/c >> 128-143 >> n/c  ?- before lunch: n/c  ?- 2h after lunch: n/c >> 139 >> n/c ?- before dinner: 100-106 >> n/c >> 83-140 >> n/c ?- 2h after dinner:  150-180 >> 185-210 >>  n/c ?- bedtime: n/c >> 120s-148 >> n/c ?- nighttime: n/c ?Lowest sugar was 78, recently 91 >> 83 >> 70 >> 78.  It is unclear at which level she has hypoglycemia awareness. ?Highest sugar was 212 (b'day party) >> 210 >> 148 >> 187. ? ?Glucometer: One Touch ? ?-No CKD, last BUN/creatinine:  ?Lab Results  ?Component Value Date  ? BUN 22 09/07/2019  ? BUN 13 03/31/2018  ? CREATININE 0.55 09/07/2019  ? CREATININE 0.60 03/31/2018  ? ?-+ HL; last set of lipids: ?Lab Results  ?Component Value Date  ? CHOL 104 09/07/2019  ? HDL 54.50 09/07/2019  ? LDLCALC 41 09/07/2019  ? TRIG 41.0 09/07/2019  ? CHOLHDL 2 09/07/2019  ?Off Lipitor 20 - stopped "some time ago". ? ?- last eye exam was in 2021: No DR reportedly. ? ?- no numbness and tingling in her feet. ? ?She had TKR in 04/2018 and a revision in 08/2017.  ? ?She has a history of thyromegaly and 3 small thyroid nodules per thyroid ultrasound from 2008: ?Clinical Data: Thyromegaly on physical exam.   ?THYROID ULTRASOUND:  ?Technique: Ultrasound examination of the thyroid gland and adjacent soft tissue structures was performed.  ?Comparison: [facility] CT Chest report, 09/18/01.  ?Findings: The thyroid gland is upper limits of normal in size to slightly enlarged with the right lobe measuring 5.2 cm long x 1.7 cm AP x 2.6  cm wide and left lobe 5.1 cm long x 1.5 cm AP x 2.2 cm wide with slight thickness at the midline isthmus measuring 5 mm. Thyroid echo texture is diffusely inhomogeneous.  ?Three nodules are seen within the thyroid gland: ?The largest at the posterior mid right is solid measuring 1 cm long x 0.7 cm AP x 0.7 cm wide.  ?Similar posterior lower pole left lobe solid nodule measures 0.7 cm long x 0.5 cm AP x 0.6 cm wide.  ?A tiny 2 mm cyst is seen at the medial superior right lobe.   ?IMPRESSION:  ?Heterogeneous borderline diffusely enlarged thyroid gland with three discrete probable adenomas. Differential diagnosis includes subtle multinodular goiter and chronic Hashimoto's  thyroiditis.   ?   ?Reviewed latest thyroid U/S report: ?Thyroid U/S (09/10/2019): ?Parenchymal Echotexture: Mildly heterogenous ?Isthmus: 0.6 cm thickness, previously 0.5 ?Right lobe: 5.6 x 1.9 x 2.2 cm, previously 5.2 x 1.7 x 2.6 ?Left lobe: 5.5 x 2.5 x 2.6 cm, previously 5.1 x 1.5 x 2.2 ?_________________________________________________________ ?  ?Nodule # 1: ?Location: Isthmus; ?Maximum size: 1.1 cm; Other 2 dimensions: 0.6 x 0.6 cm ?Composition: solid/almost completely solid (2) ?Echogenicity: hypoechoic (2) ?*Given size (>/= 1 - 1.4 cm) and appearance, a follow-up ultrasound ?in 1 year should be considered based on TI-RADS criteria. ? ?Nodule # 2: 0.9 cm hypoechoic mid right, previously 1 cm; stability ?for greater than 5 years implies benignity; This nodule does NOT ?meet TI-RADS criteria for biopsy or dedicated follow-up. ?  ?Nodule # 3: ?Location: Left; Mid ?Maximum size: 1.7 cm; Other 2 dimensions: 1.5 x 1.7 cm ?Composition: mixed cystic and solid (1) ?Echogenicity: hypoechoic (2) ?*Given size (>/= 1.5 - 2.4 cm) and appearance, a follow-up ?ultrasound in 1 year should be considered based on TI-RADS criteria. ? ________________________________________________________ ?  ?Nodule # 4: ?Location: Left; Inferior ?Maximum size: 1.3 cm; Other 2 dimensions: 0.8 x 0.9 cm ?Composition: solid/almost completely solid (2) ?Echogenicity: hypoechoic (2) ?*Given size (>/= 1 - 1.4 cm) and appearance, a follow-up ultrasound ?in 1 year should be considered based on TI-RADS criteria. ?  ?IMPRESSION: ?1. Thyromegaly with bilateral nodules. None meets criteria for biopsy. ?2. Recommend annual/biennial ultrasound follow-up of nodules as ?above, until stability x5 years confirmed. ?  ?Thyroid U/S (04/30/2021): ?Parenchymal Echotexture: Mildly heterogenous ?Isthmus: 0.6 cm, previously 0.6 cm ?Right lobe: 5.2 x 1.7 x 2.1 cm, previously 5.6 x 1.9 x 2.2 cm ?Left lobe: 5.6 x 1.8 x 2.5 cm, previously 5.5 x 2.5 x 2.6  cm ?_____________________________________________________ ? ?Previously visualized isthmus nodule is no longer conspicuous. ? ?There is a subcentimeter spongiform nodule in the left superior ?thyroid, not previously measured but partially visualized on several ?images. This nodule does not require additional follow-up. ? ?Nodule # 2 (previously labeled 3): ?Prior biopsy: No ?Location: Left; Mid ?Maximum size: 1.0 cm; Other 2 dimensions: 0.8 x 0.8 cm, previously, 1.7 x 1.7 x 1.5 cm ?Composition: spongiform (0) ?Echogenicity: isoechoic (1)  ?Significant change in size (>/= 20% in two dimensions and minimal increase of 2 mm): Yes ?Change in features: Yes ?Change in ACR TI-RADS risk category: Yes ?This nodule does NOT meet TI-RADS criteria for biopsy or dedicated testing. ?follow-up. ?________________________________________________________ ? ?Nodule # 3 (previously labeled 4): ?Prior biopsy: No ?Location: Left; Inferior ?Maximum size: 1.6 cm; Other 2 dimensions: 1.2 x 1.0 cm, previously, 1.3 x 0.8 x 0.9 cm ?Composition: solid/almost completely solid (2) ?Echogenicity: isoechoic (1) ?Significant change in size (>/= 20% in two dimensions and minimal increase of 2 mm):  Yes ?Change in features: No ?*Given size (>/= 1.5 - 2.4 cm) and appearance, a follow-up ?ultrasound in 1 year should be considered based on TI-RADS criteria. ?_________________________________________________________ ? ?No cervical lymphadenopathy. ? ?IMPRESSION: ?1. Multinodular goiter with waxing and waning nodule size. ?2. Interval enlargement of previously visualized solid nodule in the ?left inferior thyroid (labeled 3, 1.6 cm, previously labeled 4, 1.3 ?cm) which meets criteria (TI-RADS category 3) for 1 year ultrasound surveillance. ?3. Decreased size of previously visualized spongiform nodule in the ?left mid thyroid (labeled 2, 1.0 cm, previously labeled 3, 1.7 cm). ?This nodule is declared benign and does not warrant additional follow-up. ?4. The  previously visualized isthmus nodule is no longer conspicuous. ? ?Pt denies: ?- feeling nodules in neck ?- hoarseness ?- dysphagia ?- choking ?- SOB with lying down ? ?TSH reviewed: ?Lab Results  ?Component Value Date

## 2021-10-15 ENCOUNTER — Ambulatory Visit: Payer: 59 | Admitting: Internal Medicine

## 2021-10-29 ENCOUNTER — Ambulatory Visit: Payer: 59 | Admitting: Internal Medicine

## 2022-01-21 ENCOUNTER — Ambulatory Visit: Payer: 59 | Admitting: Internal Medicine

## 2022-01-21 ENCOUNTER — Encounter: Payer: Self-pay | Admitting: Internal Medicine

## 2022-01-21 VITALS — BP 132/74 | HR 77 | Ht 68.0 in | Wt 252.4 lb

## 2022-01-21 DIAGNOSIS — E042 Nontoxic multinodular goiter: Secondary | ICD-10-CM

## 2022-01-21 DIAGNOSIS — E785 Hyperlipidemia, unspecified: Secondary | ICD-10-CM

## 2022-01-21 DIAGNOSIS — E669 Obesity, unspecified: Secondary | ICD-10-CM | POA: Diagnosis not present

## 2022-01-21 DIAGNOSIS — E1165 Type 2 diabetes mellitus with hyperglycemia: Secondary | ICD-10-CM | POA: Diagnosis not present

## 2022-01-21 LAB — LIPID PANEL
Cholesterol: 143 mg/dL (ref 0–200)
HDL: 57.5 mg/dL (ref >39.00)
LDL Cholesterol: 75 mg/dL (ref 0–99)
NonHDL: 85.77
Total CHOL/HDL Ratio: 2
Triglycerides: 54 mg/dL (ref 0.0–149.0)
VLDL: 10.8 mg/dL (ref 0.0–40.0)

## 2022-01-21 LAB — COMPREHENSIVE METABOLIC PANEL
ALT: 18 U/L (ref 0–35)
AST: 15 U/L (ref 0–37)
Albumin: 4.2 g/dL (ref 3.5–5.2)
Alkaline Phosphatase: 50 U/L (ref 39–117)
BUN: 20 mg/dL (ref 6–23)
CO2: 23 mEq/L (ref 19–32)
Calcium: 9.2 mg/dL (ref 8.4–10.5)
Chloride: 108 mEq/L (ref 96–112)
Creatinine, Ser: 0.77 mg/dL (ref 0.40–1.20)
GFR: 84.82 mL/min (ref >60.00)
Glucose, Bld: 86 mg/dL (ref 70–99)
Potassium: 3.8 mEq/L (ref 3.5–5.1)
Sodium: 141 mEq/L (ref 135–145)
Total Bilirubin: 0.5 mg/dL (ref 0.2–1.2)
Total Protein: 7.3 g/dL (ref 6.0–8.3)

## 2022-01-21 LAB — MICROALBUMIN / CREATININE URINE RATIO
Creatinine,U: 109.6 mg/dL
Microalb Creat Ratio: 1 mg/g (ref 0.0–30.0)
Microalb, Ur: 1.1 mg/dL (ref 0.0–1.9)

## 2022-01-21 LAB — POCT GLYCOSYLATED HEMOGLOBIN (HGB A1C): Hemoglobin A1C: 6.3 % — AB (ref 4.0–5.6)

## 2022-01-21 MED ORDER — METFORMIN HCL ER 500 MG PO TB24: 1000.0000 mg | ORAL_TABLET | Freq: Two times a day (BID) | ORAL | 3 refills | Status: DC

## 2022-01-21 NOTE — Patient Instructions (Addendum)
Please continue Metformin ER 1000 mg 2x a day.  Please return in 6 months with your sugar log.

## 2022-01-21 NOTE — Progress Notes (Signed)
Patient ID: Diana Burns, female   DOB: 1960/11/05, 61 y.o.   MRN: [id number]   HPI: Diana Burns is a 61 y.o.-year-old female, initially referred by her PCP, Dr. Everlene Farrier , returning for follow-up for DM2, dx in 10/2015, non-insulin-dependent, controlled, without long term complications.  Last visit 9 months ago.  Previous visit 1 year and 2 mo prior.  Interim history: Previously his last visit, she started a diet with fruit/veggies + meat + Juice Plus fiber supplement. She cut out: carbs, gluten, added sugars, dairy, salt.  She was also trying to build up to 10,000 steps a day.  At this visit, she relaxed her diet, but she still eats a lot of fruit and vegetables.  She is still very active at work, mostly totaling more than 15,000 steps a day. No increased urination, blurry vision, nausea, chest pain.  She does have leg swelling from walking so much at work in the heat.  Reviewed hx: Pt was dx'ed with DM after an injury to her finger >> ED >> labs showed a Glu in the 300. Before diagnosis, She had a period of few months of increased stress  - very busy, many deaths among family and close friends. She was eating once a day, sleeping 2-3 hours a day - working days and nights. She also had steroid inj in knee (she will need to have total knee replacement), Prednisone for a tooth abscess, and Prednisone for laryngitis.  Reviewed HbA1c levels: Lab Results  Component Value Date   HGBA1C 6.1 (A) 03/27/2021   HGBA1C 6.3 (A) 01/14/2020   HGBA1C 7.9 (A) 09/07/2019   Pt is on a regimen of: - Metformin ER 1000 mg in am >> 1000 mg 2x a day She did not start Farxiga 5 mg before breakfast as suggested 05/2019. She was previously on Januvia.  Pt was checking sugars 0-1x a day: - am: 81-110s >> n/c >> 91-100 >> 90s-130 >> 70 >> n/c >> 84, 110-118 - 2h after b'fast: n/c >> 128-143 >> n/c  - before lunch: n/c  - 2h after lunch: n/c >> 139 >> n/c - before dinner: 100-106 >> n/c >> 83-140 >> n/c - 2h after  dinner:  150-180 >> 185-210 >> n/c - bedtime: n/c >> 120s-148 >> n/c >> <120 - nighttime: n/c Lowest sugar was 78, recently 91 >> 83 >> 70 >> 78 >> 77.  It is unclear at which level she has hypoglycemia awareness. Highest sugar was 212 (b'day party) >> 210 >> 148 >> 187 >> 183 (after icecream).  Glucometer: One Touch  -No CKD, last BUN/creatinine:  Lab Results  Component Value Date   BUN 22 09/07/2019   BUN 13 03/31/2018   CREATININE 0.55 09/07/2019   CREATININE 0.60 03/31/2018   -+ HL; last set of lipids: Lab Results  Component Value Date   CHOL 104 09/07/2019   HDL 54.50 09/07/2019   LDLCALC 41 09/07/2019   TRIG 41.0 09/07/2019   CHOLHDL 2 09/07/2019  At last visit I advised her to restart Lipitor 20 mg daily. However, she is not taking it.  - last eye exam was 01/14/2022: No DR reportedly. Astigmatism resolved.  She sees Dr. Tamala Julian.  - no numbness and tingling in her feet.  She has a history of thyromegaly and 3 small thyroid nodules per thyroid ultrasound from 2008: Comparison: Diana Burns CT Chest report, 09/18/01.  Findings: The thyroid gland is upper limits of normal in size to slightly enlarged with the right lobe measuring 5.2  cm long x 1.7 cm AP x 2.6 cm wide and left lobe 5.1 cm long x 1.5 cm AP x 2.2 cm wide with slight thickness at the midline isthmus measuring 5 mm. Thyroid echo texture is diffusely inhomogeneous.  Three nodules are seen within the thyroid gland: The largest at the posterior mid right is solid measuring 1 cm long x 0.7 cm AP x 0.7 cm wide.  Similar posterior lower pole left lobe solid nodule measures 0.7 cm long x 0.5 cm AP x 0.6 cm wide.  A tiny 2 mm cyst is seen at the medial superior right lobe.   IMPRESSION:  Heterogeneous borderline diffusely enlarged thyroid gland with three discrete probable adenomas. Differential diagnosis includes subtle multinodular goiter and chronic Hashimoto's thyroiditis.      Thyroid U/S (09/10/2019): Parenchymal  Echotexture: Mildly heterogenous Isthmus: 0.6 cm thickness, previously 0.5 Right lobe: 5.6 x 1.9 x 2.2 cm, previously 5.2 x 1.7 x 2.6 Left lobe: 5.5 x 2.5 x 2.6 cm, previously 5.1 x 1.5 x 2.2 _________________________________________________________   Nodule # 1: Location: Isthmus; Maximum size: 1.1 cm; Other 2 dimensions: 0.6 x 0.6 cm Composition: solid/almost completely solid (2) Echogenicity: hypoechoic (2) *Given size (>/= 1 - 1.4 cm) and appearance, a follow-up ultrasound in 1 year should be considered based on TI-RADS criteria.  Nodule # 2: 0.9 cm hypoechoic mid right, previously 1 cm; stability for greater than 5 years implies benignity; This nodule does NOT meet TI-RADS criteria for biopsy or dedicated follow-up.   Nodule # 3: Location: Left; Mid Maximum size: 1.7 cm; Other 2 dimensions: 1.5 x 1.7 cm Composition: mixed cystic and solid (1) Echogenicity: hypoechoic (2) *Given size (>/= 1.5 - 2.4 cm) and appearance, a follow-up ultrasound in 1 year should be considered based on TI-RADS criteria.  ________________________________________________________   Nodule # 4: Location: Left; Inferior Maximum size: 1.3 cm; Other 2 dimensions: 0.8 x 0.9 cm Composition: solid/almost completely solid (2) Echogenicity: hypoechoic (2) *Given size (>/= 1 - 1.4 cm) and appearance, a follow-up ultrasound in 1 year should be considered based on TI-RADS criteria.   IMPRESSION: 1. Thyromegaly with bilateral nodules. None meets criteria for biopsy. 2. Recommend annual/biennial ultrasound follow-up of nodules as above, until stability x5 years confirmed.   Thyroid U/S (04/30/2021): Parenchymal Echotexture: Mildly heterogenous Isthmus: 0.6 cm, previously 0.6 cm Right lobe: 5.2 x 1.7 x 2.1 cm, previously 5.6 x 1.9 x 2.2 cm Left lobe: 5.6 x 1.8 x 2.5 cm, previously 5.5 x 2.5 x 2.6 cm _____________________________________________________  Previously visualized isthmus nodule is no longer  conspicuous.  There is a subcentimeter spongiform nodule in the left superior thyroid, not previously measured but partially visualized on several images. This nodule does not require additional follow-up.  Nodule # 2 (previously labeled 3): Prior biopsy: No Location: Left; Mid Maximum size: 1.0 cm; Other 2 dimensions: 0.8 x 0.8 cm, previously, 1.7 x 1.7 x 1.5 cm Composition: spongiform (0) Echogenicity: isoechoic (1)  Significant change in size (>/= 20% in two dimensions and minimal increase of 2 mm): Yes Change in features: Yes Change in ACR TI-RADS risk category: Yes This nodule does NOT meet TI-RADS criteria for biopsy or dedicated testing. follow-up. ________________________________________________________  Nodule # 3 (previously labeled 4): Prior biopsy: No Location: Left; Inferior Maximum size: 1.6 cm; Other 2 dimensions: 1.2 x 1.0 cm, previously, 1.3 x 0.8 x 0.9 cm Composition: solid/almost completely solid (2) Echogenicity: isoechoic (1) Significant change in size (>/= 20% in two dimensions and minimal increase  of 2 mm): Yes Change in features: No *Given size (>/= 1.5 - 2.4 cm) and appearance, a follow-up ultrasound in 1 year should be considered based on TI-RADS criteria. _________________________________________________________  No cervical lymphadenopathy.  IMPRESSION: 1. Multinodular goiter with waxing and waning nodule size. 2. Interval enlargement of previously visualized solid nodule in the left inferior thyroid (labeled 3, 1.6 cm, previously labeled 4, 1.3 cm) which meets criteria (TI-RADS category 3) for 1 year ultrasound surveillance. 3. Decreased size of previously visualized spongiform nodule in the left mid thyroid (labeled 2, 1.0 cm, previously labeled 3, 1.7 cm). This nodule is declared benign and does not warrant additional follow-up. 4. The previously visualized isthmus nodule is no longer conspicuous.  Pt denies: - feeling nodules in neck -  hoarseness - dysphagia - choking  TSH reviewed: Lab Results  Component Value Date   TSH 1.41 09/07/2019   TSH 0.49 11/27/2015   TSH 1.305 05/03/2014   She had TKR in 04/2018 and a revision in 08/2017.   ROS: + See HPI  I reviewed pt's medications, allergies, PMH, social hx, family hx, and changes were documented in the history of present illness. Otherwise, unchanged from my initial visit note.  Past Medical History:  Diagnosis Date   Allergy    Anemia    Arthritis    Diabetes mellitus without complication ([place])    Past Surgical History:  Procedure Laterality Date   KNEE SURGERY     TOTAL KNEE ARTHROPLASTY Left 05/23/2017   Procedure: LEFT TOTAL KNEE ARTHROPLASTY;  Surgeon: Sydnee Cabal, MD;  Location: [facility];  Service: Orthopedics;  Laterality: Left;   Social History   Social History   Marital status: Single    Spouse name: N/A   Number of children: 0   Occupational History   Charity fundraiser    Social History Main Topics   Smoking status: Never Smoker   Smokeless tobacco: Not on file   Alcohol use Yes - wine and mixed drinks rarely    Drug use: No   Social History Narrative   Raised between [place] and [place].   Current Outpatient Medications on File Prior to Visit  Medication Sig Dispense Refill   atorvastatin (LIPITOR) 20 MG tablet Take 1 tablet (20 mg total) by mouth daily. 90 tablet 3   blood glucose meter kit and supplies KIT Dispense based on patient and insurance preference. Use up to four times daily as directed. (FOR ICD-9 250.00, 250.01). 1 each 0   metFORMIN (GLUCOPHAGE-XR) 500 MG 24 hr tablet Take 2 tablets (1,000 mg total) by mouth 2 (two) times daily. 360 tablet 3   Multiple Vitamin (MULTIVITAMIN WITH MINERALS) TABS tablet Take 1 tablet daily by mouth.     No current facility-administered medications on file prior to visit.   Allergies  Allergen Reactions   Aspartame    Aspartame And Phenylalanine Other (See  Comments)    Artificial sweetners - headache   Bee Venom Swelling   Grass Extracts [Gramineae Pollens]    Latex Hives and Itching   Phenylalanine Other (See Comments)    Artificial sweetners - headache   Pollen Extract    Family History  Problem Relation Age of Onset   Cancer Mother    Heart disease Mother    Hyperlipidemia Mother    Hypertension Mother    PE: BP 132/74 (BP Location: Left Arm, Patient Position: Sitting, Cuff Size: Normal)   Pulse 77   Ht _0  (1.727 m)   Wt  252 lb 6.4 oz (114.5 kg)   SpO2 99%   BMI 38.38 kg/m  Wt Readings from Last 3 Encounters:  01/21/22 252 lb 6.4 oz (114.5 kg)  03/27/21 239 lb 3.2 oz (108.5 kg)  01/14/20 (!) 238 lb (108 kg)   Constitutional: overweight, in NAD Eyes: no exophthalmos ENT: moist mucous membranes, no masses palpated in neck, no cervical lymphadenopathy Cardiovascular: RRR, No MRG, + significant pitting edema bilateral LE and feet Respiratory: CTA B Musculoskeletal: no deformities Skin: moist, warm, no rashes Neurological: no tremor with outstretched hands Diabetic Foot Exam - Simple   Simple Foot Form Diabetic Foot exam was performed with the following findings: Yes 01/21/2022  9:06 AM  Visual Inspection No deformities, no ulcerations, no other skin breakdown bilaterally: Yes See comments: Yes Sensation Testing Intact to touch and monofilament testing bilaterally: Yes Pulse Check See comments: Yes Comments Long, thick, toenails - B B significant foot pitting edema.  Pulses not well appreciated likely 2/2 swelling.    ASSESSMENT: 1. DM2, non-insulin-dependent, controlled, without long term complications but with hyperglycemia  2. Obesity class II BMI Classification: < 18.5 underweight  18.5-24.9 normal weight  25.0-29.9 overweight  30.0-34.9 class I obesity  35.0-39.9 class II obesity  ? 40.0 class III obesity   3. HL  4.  Multiple thyroid nodules  PLAN:  1. Patient with history of controlled  diabetes, with improved blood sugars after starting to improve her diet, starting exercise and taking her medications as prescribed.  However, during to the coronavirus pandemic, she relaxed her diet and stopped exercise.  She gained approximately 35 pounds and sugars worsened.  Afterwards, she started to improve her diet again and trying to walk and build up to 10,000 steps a day.  HbA1c at last visit was better, at 6.1%.  We did not change her regimen at that time but she was not checking sugars at all and I advised her to start.  She now returns after 9 months. -At today's visit, her sugars appear to be still well controlled.  She is not checking in the middle of the day but at the beginning and the end of the day and these sugars appear to be at goal.  We will continue the current regimen. - I suggested to:   Patient Instructions  Please continue Metformin ER 1000 mg 2x a day.  Please return in 6 months with your sugar log.   - we checked her HbA1c: 6.3% (higher, but still at goal) - advised to check sugars at different times of the day - 1x a day, rotating check times - advised for yearly eye exams >> she is not UTD - return to clinic in 6 months  2.  Obesity class II -We will continue metformin ER which has an appetite suppressant effect -Weight was stable at last visit, previously lost 10 pounds -at this visit, she gained 13 lbs since our last visit  3. HL -Reviewed latest lipid panel from 08/2019: All fractions at goal: Lab Results  Component Value Date   CHOL 104 09/07/2019   HDL 54.50 09/07/2019   LDLCALC 41 09/07/2019   TRIG 41.0 09/07/2019   CHOLHDL 2 09/07/2019  -At last visit she was off Lipitor 20 mg daily as she ran out.  I refilled this for her and advised her to restart.  She did not start yet. -She is due for lipid panel-we will recheck this today.  We will discuss after the results come back if she needs  to start Lipitor.  4.  Multiple thyroid nodules -She has  thyromegaly on palpation but no neck compression symptoms -Reviewed latest ultrasound obtained in 04/2021 which showed that the isthmic nodule was no longer conspicuous.  She has a subcentimeter left superior thyroid nodule which is not worrisome and no further follow-up is needed.  Her left mid thyroid nodule has decreased in size and no follow-up is needed for this.  Her left inferior thyroid nodule appears to be slightly larger and the recommendation was made for repeat ultrasound in a year. -No thyromegaly on palpation or neck compression symptoms. -We will repeat the thyroid ultrasound at next visit -Latest TSH was normal Lab Results  Component Value Date   TSH 1.41 09/07/2019   Component     Latest Ref Rng 01/21/2022  Cholesterol     0 - 200 mg/dL 143   Triglycerides     0.0 - 149.0 mg/dL 54.0   HDL Cholesterol     >39.00 mg/dL 57.50   VLDL     0.0 - 40.0 mg/dL 10.8   LDL (calc)     0 - 99 mg/dL 75   Total CHOL/HDL Ratio 2   NonHDL 85.77   Microalb, Ur     0.0 - 1.9 mg/dL 1.1   Creatinine,U     mg/dL 109.6   MICROALB/CREAT RATIO     0.0 - 30.0 mg/g 1.0   Glucose     70 - 99 mg/dL 86   BUN     6 - 23 mg/dL 20   Creatinine     0.40 - 1.20 mg/dL 0.77   Sodium     135 - 145 mEq/L 141   Potassium     3.5 - 5.1 mEq/L 3.8   Chloride     96 - 112 mEq/L 108   CO2     19 - 32 mEq/L 23   Calcium     8.4 - 10.5 mg/dL 9.2   Total Protein     6.0 - 8.3 g/dL 7.3   Total Bilirubin     0.2 - 1.2 mg/dL 0.5   AST     0 - 37 U/L 15   ALT     0 - 35 U/L 18   Hemoglobin A1C     4.0 - 5.6 % 6.3 !   Alkaline Phosphatase     39 - 117 U/L 50   Albumin     3.5 - 5.2 g/dL 4.2   GFR     >60.00 mL/min 84.82     Labs are at goal.  Philemon Kingdom, MD PhD [facility]

## 2022-07-22 ENCOUNTER — Ambulatory Visit: Payer: 59 | Admitting: Internal Medicine

## 2022-07-22 ENCOUNTER — Encounter: Payer: Self-pay | Admitting: Internal Medicine

## 2022-07-22 VITALS — BP 120/74 | HR 82 | Ht 68.0 in | Wt 267.0 lb

## 2022-07-22 DIAGNOSIS — E785 Hyperlipidemia, unspecified: Secondary | ICD-10-CM

## 2022-07-22 DIAGNOSIS — E1165 Type 2 diabetes mellitus with hyperglycemia: Secondary | ICD-10-CM | POA: Diagnosis not present

## 2022-07-22 DIAGNOSIS — E042 Nontoxic multinodular goiter: Secondary | ICD-10-CM

## 2022-07-22 DIAGNOSIS — E669 Obesity, unspecified: Secondary | ICD-10-CM | POA: Diagnosis not present

## 2022-07-22 LAB — POCT GLYCOSYLATED HEMOGLOBIN (HGB A1C): Hemoglobin A1C: 7.6 % — AB (ref 4.0–5.6)

## 2022-07-22 MED ORDER — DAPAGLIFLOZIN PROPANEDIOL 10 MG PO TABS: 10.0000 mg | ORAL_TABLET | Freq: Every day | ORAL | 3 refills | Status: DC

## 2022-07-22 NOTE — Progress Notes (Signed)
Patient ID: Diana Burns, female   DOB: 07-21-1960, 62 y.o.   MRN: [id number]   HPI: Diana Burns is a 62 y.o.-year-old female, initially referred by her PCP, Dr. Cleta Alberts , returning for follow-up for DM2, dx in 10/2015, non-insulin-dependent, controlled, without long term complications.  Last visit 6 months ago.  Interim history: Previously his last visit, she started a diet with fruit/veggies + meat + Juice Plus fiber supplement. She cut out: carbs, gluten, added sugars, dairy, salt.  At last visit, she  relaxed her diet, but she still eating a lot of fruit and vegetables.  She is still very active at work, mostly totaling more than 15,000 steps a day. Since last OV, she relaxed her diet especially over the [place]. No increased urination, blurry vision, nausea, chest pain.  She has a lot of fluid retention-leg swelling, as she was up all night in the hospital with a family member being sick.  Reviewed hx: Pt was dx'ed with DM after an injury to her finger >> ED >> labs showed a Glu in the 300. Before diagnosis, She had a period of few months of increased stress  - very busy, many deaths among family and close friends. She was eating once a day, sleeping 2-3 hours a day - working days and nights. She also had steroid inj in knee (she will need to have total knee replacement), Prednisone for a tooth abscess, and Prednisone for laryngitis.  Reviewed HbA1c levels: Lab Results  Component Value Date   HGBA1C 6.3 (A) 01/21/2022   HGBA1C 6.1 (A) 03/27/2021   HGBA1C 6.3 (A) 01/14/2020   Pt is on a regimen of: - Metformin ER 1000 mg in am >> 1000 mg 2x a day She did not start Farxiga 5 mg before breakfast as suggested 05/2019. She was previously on Januvia.  Pt was checking sugars 0-1x a day: - am: 91-100 >> 90s-130 >> 70 >> n/c >> 84, 110-118 >> 116-120 - 2h after b'fast: n/c >> 128-143 >> n/c  - before lunch: n/c  - 2h after lunch: n/c >> 139 >> n/c >> <180, occas. 185 - before dinner: 100-106  >> n/c >> 83-140 >> n/c - 2h after dinner:  150-180 >> 185-210 >> n/c - bedtime: n/c >> 120s-148 >> n/c >> <120 >> <190 - nighttime: n/c Lowest sugar was 78 >> 77 >> 100.  It is unclear at which level she has hypoglycemia awareness. Highest sugar was 187 >> 183 (after icecream) >> 190.  Glucometer: One Touch  -No CKD, last BUN/creatinine:  Lab Results  Component Value Date   BUN 20 01/21/2022   BUN 22 09/07/2019   CREATININE 0.77 01/21/2022   CREATININE 0.55 09/07/2019   -+ HL; last set of lipids: Lab Results  Component Value Date   CHOL 143 01/21/2022   HDL 57.50 01/21/2022   LDLCALC 75 01/21/2022   TRIG 54.0 01/21/2022   CHOLHDL 2 01/21/2022  At last visit I advised her to restart Lipitor 20 mg daily. However, she is not taking it.  - last eye exam was 01/14/2022: No DR reportedly. Astigmatism resolved.  She sees Dr. Katrinka Blazing.  - no numbness and tingling in her feet.  Last foot exam 01/21/2022.  She has a history of thyromegaly and 3 small thyroid nodules per thyroid ultrasound from 2008: Comparison: [facility] CT Chest report, 09/18/01.  Findings: The thyroid gland is upper limits of normal in size to slightly enlarged with the right lobe measuring 5.2 cm long x 1.7  cm AP x 2.6 cm wide and left lobe 5.1 cm long x 1.5 cm AP x 2.2 cm wide with slight thickness at the midline isthmus measuring 5 mm. Thyroid echo texture is diffusely inhomogeneous.  Three nodules are seen within the thyroid gland: The largest at the posterior mid right is solid measuring 1 cm long x 0.7 cm AP x 0.7 cm wide.  Similar posterior lower pole left lobe solid nodule measures 0.7 cm long x 0.5 cm AP x 0.6 cm wide.  A tiny 2 mm cyst is seen at the medial superior right lobe.   IMPRESSION:  Heterogeneous borderline diffusely enlarged thyroid gland with three discrete probable adenomas. Differential diagnosis includes subtle multinodular goiter and chronic Hashimoto's thyroiditis.      Thyroid U/S  (09/10/2019): Parenchymal Echotexture: Mildly heterogenous Isthmus: 0.6 cm thickness, previously 0.5 Right lobe: 5.6 x 1.9 x 2.2 cm, previously 5.2 x 1.7 x 2.6 Left lobe: 5.5 x 2.5 x 2.6 cm, previously 5.1 x 1.5 x 2.2 _________________________________________________________   Nodule # 1: Location: Isthmus; Maximum size: 1.1 cm; Other 2 dimensions: 0.6 x 0.6 cm Composition: solid/almost completely solid (2) Echogenicity: hypoechoic (2) *Given size (>/= 1 - 1.4 cm) and appearance, a follow-up ultrasound in 1 year should be considered based on TI-RADS criteria.  Nodule # 2: 0.9 cm hypoechoic mid right, previously 1 cm; stability for greater than 5 years implies benignity; This nodule does NOT meet TI-RADS criteria for biopsy or dedicated follow-up.   Nodule # 3: Location: Left; Mid Maximum size: 1.7 cm; Other 2 dimensions: 1.5 x 1.7 cm Composition: mixed cystic and solid (1) Echogenicity: hypoechoic (2) *Given size (>/= 1.5 - 2.4 cm) and appearance, a follow-up ultrasound in 1 year should be considered based on TI-RADS criteria.  ________________________________________________________   Nodule # 4: Location: Left; Inferior Maximum size: 1.3 cm; Other 2 dimensions: 0.8 x 0.9 cm Composition: solid/almost completely solid (2) Echogenicity: hypoechoic (2) *Given size (>/= 1 - 1.4 cm) and appearance, a follow-up ultrasound in 1 year should be considered based on TI-RADS criteria.   IMPRESSION: 1. Thyromegaly with bilateral nodules. None meets criteria for biopsy. 2. Recommend annual/biennial ultrasound follow-up of nodules as above, until stability x5 years confirmed.   Thyroid U/S (04/30/2021): Parenchymal Echotexture: Mildly heterogenous Isthmus: 0.6 cm, previously 0.6 cm Right lobe: 5.2 x 1.7 x 2.1 cm, previously 5.6 x 1.9 x 2.2 cm Left lobe: 5.6 x 1.8 x 2.5 cm, previously 5.5 x 2.5 x 2.6 cm _____________________________________________________  Previously visualized  isthmus nodule is no longer conspicuous.  There is a subcentimeter spongiform nodule in the left superior thyroid, not previously measured but partially visualized on several images. This nodule does not require additional follow-up.  Nodule # 2 (previously labeled 3): Prior biopsy: No Location: Left; Mid Maximum size: 1.0 cm; Other 2 dimensions: 0.8 x 0.8 cm, previously, 1.7 x 1.7 x 1.5 cm Composition: spongiform (0) Echogenicity: isoechoic (1)  Significant change in size (>/= 20% in two dimensions and minimal increase of 2 mm): Yes Change in features: Yes Change in ACR TI-RADS risk category: Yes This nodule does NOT meet TI-RADS criteria for biopsy or dedicated testing. follow-up. ________________________________________________________  Nodule # 3 (previously labeled 4): Prior biopsy: No Location: Left; Inferior Maximum size: 1.6 cm; Other 2 dimensions: 1.2 x 1.0 cm, previously, 1.3 x 0.8 x 0.9 cm Composition: solid/almost completely solid (2) Echogenicity: isoechoic (1) Significant change in size (>/= 20% in two dimensions and minimal increase of 2 mm): Yes  Change in features: No *Given size (>/= 1.5 - 2.4 cm) and appearance, a follow-up ultrasound in 1 year should be considered based on TI-RADS criteria. _________________________________________________________  No cervical lymphadenopathy.  IMPRESSION: 1. Multinodular goiter with waxing and waning nodule size. 2. Interval enlargement of previously visualized solid nodule in the left inferior thyroid (labeled 3, 1.6 cm, previously labeled 4, 1.3 cm) which meets criteria (TI-RADS category 3) for 1 year ultrasound surveillance. 3. Decreased size of previously visualized spongiform nodule in the left mid thyroid (labeled 2, 1.0 cm, previously labeled 3, 1.7 cm). This nodule is declared benign and does not warrant additional follow-up. 4. The previously visualized isthmus nodule is no longer conspicuous.  Pt denies: -  feeling nodules in neck - hoarseness - dysphagia - choking  TSH reviewed: Lab Results  Component Value Date   TSH 1.41 09/07/2019   TSH 0.49 11/27/2015   TSH 1.305 05/03/2014   She had TKR in 04/2018 and a revision in 08/2017.   ROS: + See HPI  I reviewed pt's medications, allergies, PMH, social hx, family hx, and changes were documented in the history of present illness. Otherwise, unchanged from my initial visit note.  Past Medical History:  Diagnosis Date   Allergy    Anemia    Arthritis    Diabetes mellitus without complication (HCC)    Past Surgical History:  Procedure Laterality Date   KNEE SURGERY     TOTAL KNEE ARTHROPLASTY Left 05/23/2017   Procedure: LEFT TOTAL KNEE ARTHROPLASTY;  Surgeon: Eugenia Mcalpine, MD;  Location: [facility];  Service: Orthopedics;  Laterality: Left;   Social History   Social History   Marital status: Single    Spouse name: N/A   Number of children: 0   Occupational History   Geophysical data processor    Social History Main Topics   Smoking status: Never Smoker   Smokeless tobacco: Not on file   Alcohol use Yes - wine and mixed drinks rarely    Drug use: No   Social History Narrative   Raised between [place] and [place].   Current Outpatient Medications on File Prior to Visit  Medication Sig Dispense Refill   atorvastatin (LIPITOR) 20 MG tablet Take 1 tablet (20 mg total) by mouth daily. 90 tablet 3   blood glucose meter kit and supplies KIT Dispense based on patient and insurance preference. Use up to four times daily as directed. (FOR ICD-9 250.00, 250.01). 1 each 0   metFORMIN (GLUCOPHAGE-XR) 500 MG 24 hr tablet Take 2 tablets (1,000 mg total) by mouth 2 (two) times daily. 360 tablet 3   Multiple Vitamin (MULTIVITAMIN WITH MINERALS) TABS tablet Take 1 tablet daily by mouth.     No current facility-administered medications on file prior to visit.   Allergies  Allergen Reactions   Aspartame    Aspartame And  Phenylalanine Other (See Comments)    Artificial sweetners - headache   Bee Venom Swelling   Grass Extracts [Gramineae Pollens]    Latex Hives and Itching   Phenylalanine Other (See Comments)    Artificial sweetners - headache   Pollen Extract    Family History  Problem Relation Age of Onset   Cancer Mother    Heart disease Mother    Hyperlipidemia Mother    Hypertension Mother    PE: BP 120/74 (BP Location: Left Arm, Patient Position: Sitting, Cuff Size: Normal)   Pulse 82   Ht 5\' 8"  (1.727 m)   Wt 267 lb (121.1 kg)  SpO2 99%   BMI 40.60 kg/m  Wt Readings from Last 3 Encounters:  07/22/22 267 lb (121.1 kg)  01/21/22 252 lb 6.4 oz (114.5 kg)  03/27/21 239 lb 3.2 oz (108.5 kg)   Constitutional: overweight, in NAD Eyes: no exophthalmos ENT: moist mucous membranes, no masses palpated in neck, no cervical lymphadenopathy Cardiovascular: RRR, No MRG, + significant pitting edema bilateral LE and feet (positional edema) Respiratory: CTA B Musculoskeletal: no deformities Skin: moist, warm, no rashes Neurological: no tremor with outstretched hands  ASSESSMENT: 1. DM2, non-insulin-dependent, controlled, without long term complications but with hyperglycemia  2. Obesity class II BMI Classification: < 18.5 underweight  18.5-24.9 normal weight  25.0-29.9 overweight  30.0-34.9 class I obesity  35.0-39.9 class II obesity  ? 40.0 class III obesity   3. HL  4.  Multiple thyroid nodules  PLAN:  1. Patient with history of controlled diabetes, with improved blood sugars after she started to improve her diet, starting exercise, and taking her medications as prescribed.  However, during the coronavirus pandemic, she relaxed her diet and stopped exercise.  She gained 35 pounds and sugars worsened.  Afterwards, she started to improve her diet again and trying to walk for exercise and HbA1c decreased.  At last visit, this was 6.3%, slightly higher than before, but still at goal.   Sugars were well-controlled.  We did not change her regimen. -At today's visit, sugars are mostly at goal, however, HbA1c is higher.  I suspect that we are missing higher blood sugars.  We discussed about trying to add an SGLT2 inhibitor, Wilder Glade, to help with both blood sugars and weight.  Moreover, I explained that this may act like a diuretic and help with her fluid retention.  Advised her to stay well-hydrated while on the occasion.  Also, discussed about benefits and possible side effects.  Will continue metformin for now. - I suggested to:   Patient Instructions  Please continue Metformin ER 1000 mg 2x a day.  Try to add: - Farxiga 10 mg before b'fast.  Let's schedule a new thyroid U/S.  Please return in 4 months with your sugar log.   - we checked her HbA1c: 7.6% (higher) - advised to check sugars at different times of the day - 1x a day, rotating check times - advised for yearly eye exams >> she is UTD - return to clinic in 4 months  2.  Obesity class II -Will continue metformin ER which has an appetite suppressant effect long-term -She gained 17 pounds before last visit, previously lost 10 -At today's visit, she gained another 16 pounds, but some of it is fluids-significant leg swelling - will try to add an SGLT2 inhibitor  3. HL -Reviewed latest lipid panel from 12/2021: Lab Results  Component Value Date   CHOL 143 01/21/2022   HDL 57.50 01/21/2022   LDLCALC 75 01/21/2022   TRIG 54.0 01/21/2022   CHOLHDL 2 01/21/2022  -Previously on Lipitor 20 mg daily but ran out.  At last visit, fractions were at goal so we did not restart this.  4.  Multiple thyroid nodules -Reviewed latest ultrasound obtained in 04/2021 which showed that the isthmic nodule was no longer conspicuous.  She has a subcentimeter left superior thyroid nodule which is not worrisome and no further follow-up is needed.  Her left mid thyroid nodule has decreased in size and no follow-up is needed for this.  Her  left inferior thyroid nodule appears to be slightly larger and the recommendation was  for repeat ultrasound in a year -she has thyromegaly on palpation but no neck compression symptoms -We will repeat the ultrasound now -Latest TSH is normal: Lab Results  Component Value Date   TSH 1.41 09/07/2019   Carlus Pavlov, MD PhD [facility] Endocrinology

## 2022-07-22 NOTE — Patient Instructions (Addendum)
Please continue Metformin ER 1000 mg 2x a day.  Try to add: - Farxiga 10 mg before b'fast.  Let's schedule a new thyroid U/S.  Please return in 4 months with your sugar log.

## 2022-08-06 IMAGING — US US THYROID
1 series · 12 of 25 positions shown · non-contrast
Comparison: 09/09/2019

CLINICAL DATA: Prior ultrasound follow-up.

EXAM:
THYROID ULTRASOUND
TECHNIQUE: Ultrasound examination of the thyroid gland and adjacent soft
tissues was performed.

[Series 1: us thyroid · 0.06mm/px · 12 of 42 slices shown]
[im 2/42]
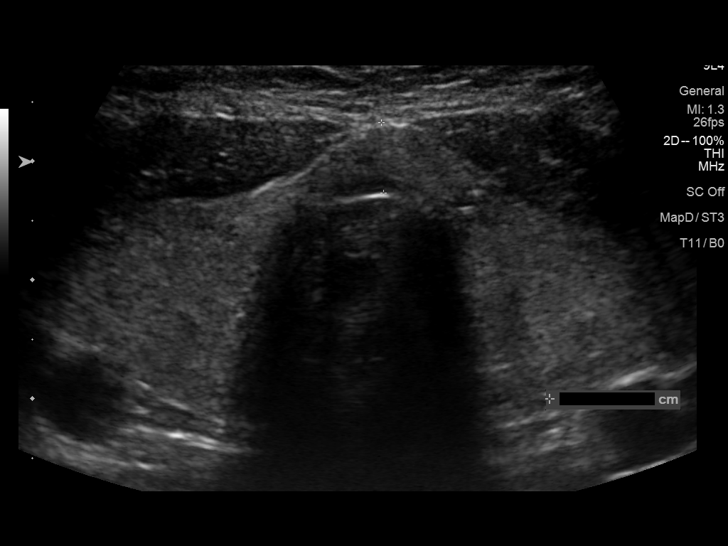
[im 6/42]
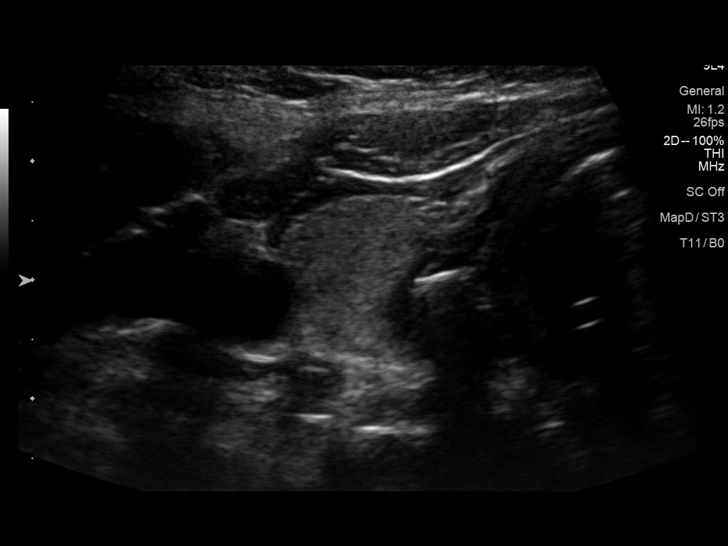
[im 9/42]
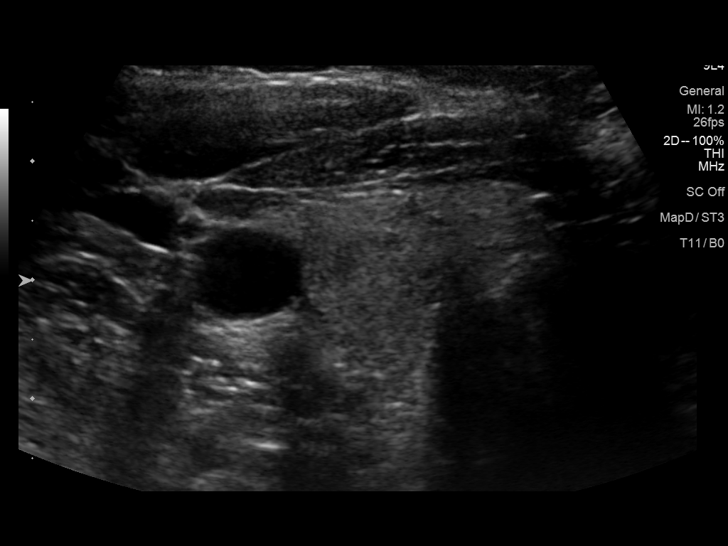
[im 12/42]
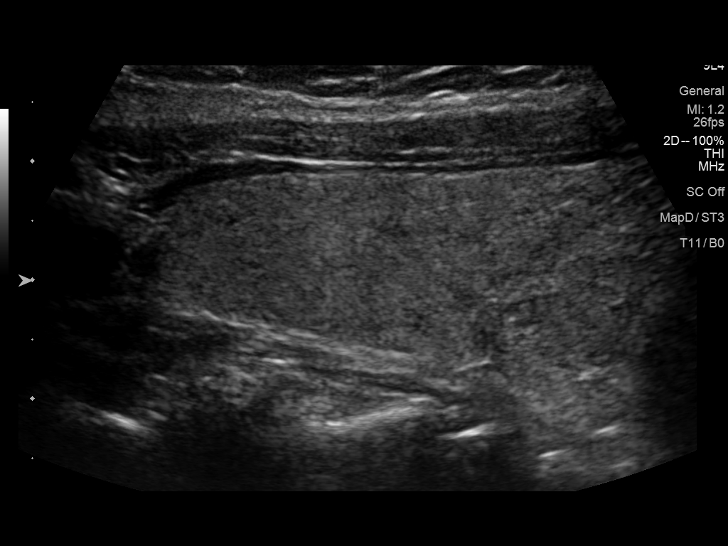
[im 16/42]
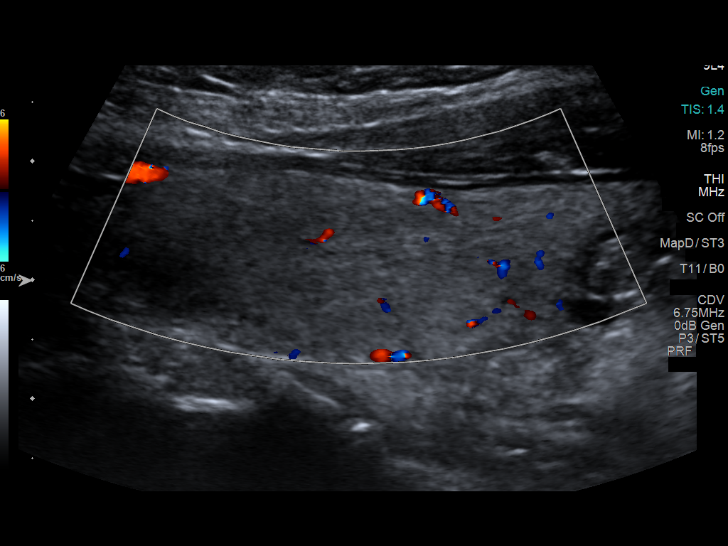
[im 19/42]
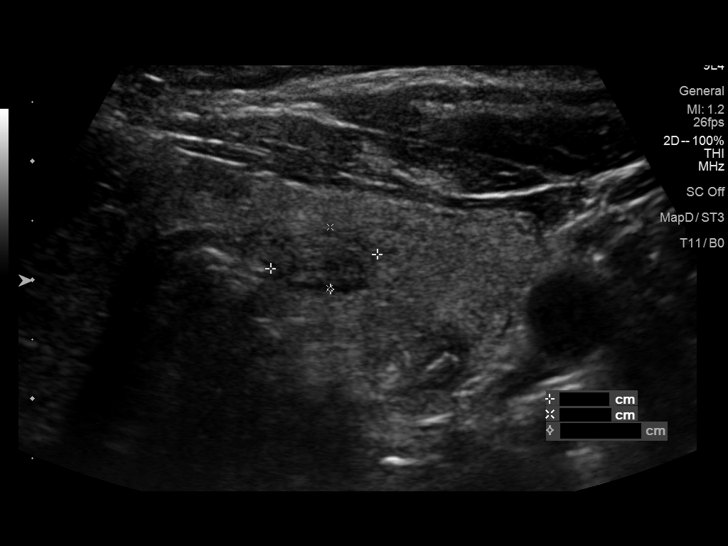
[im 23/42]
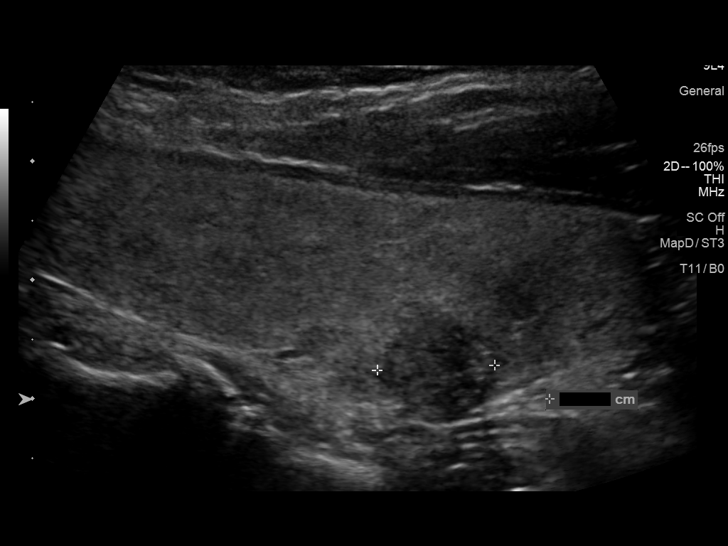
[im 26/42]
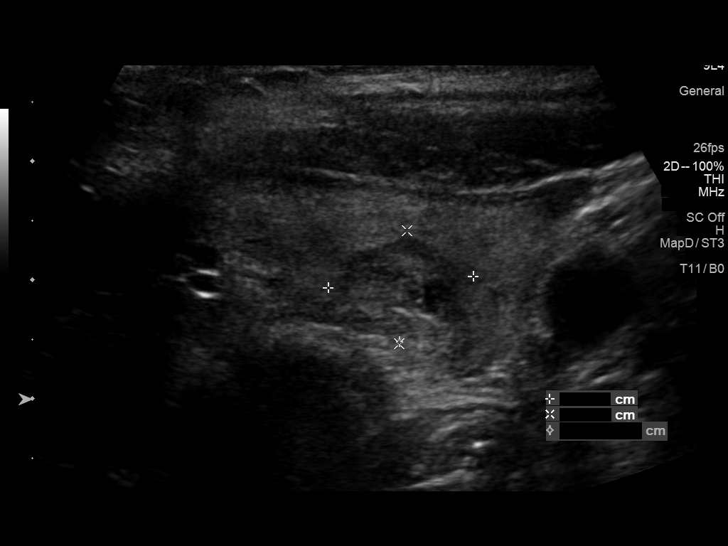
[im 30/42]
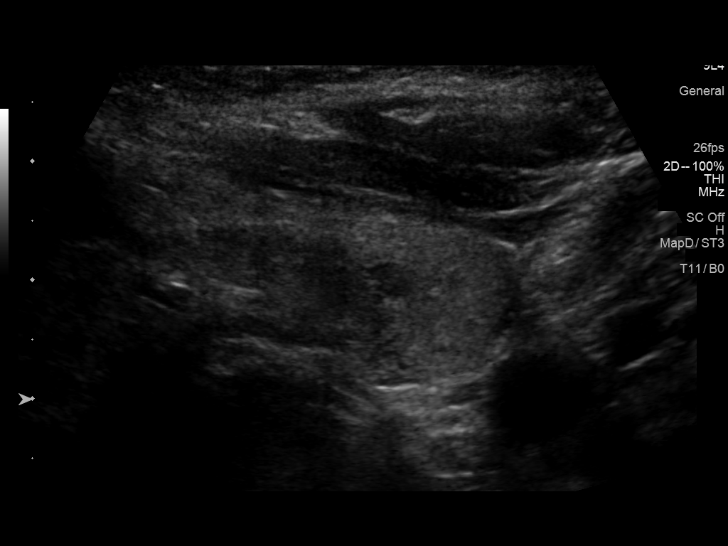
[im 33/42]
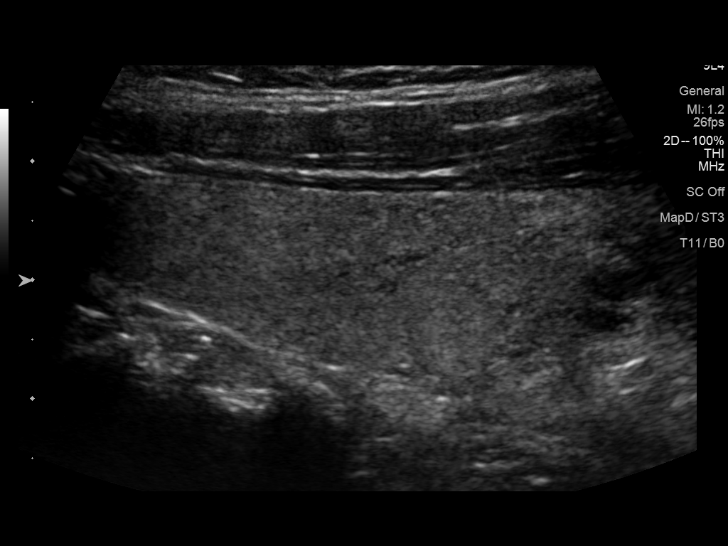
[im 36/42]
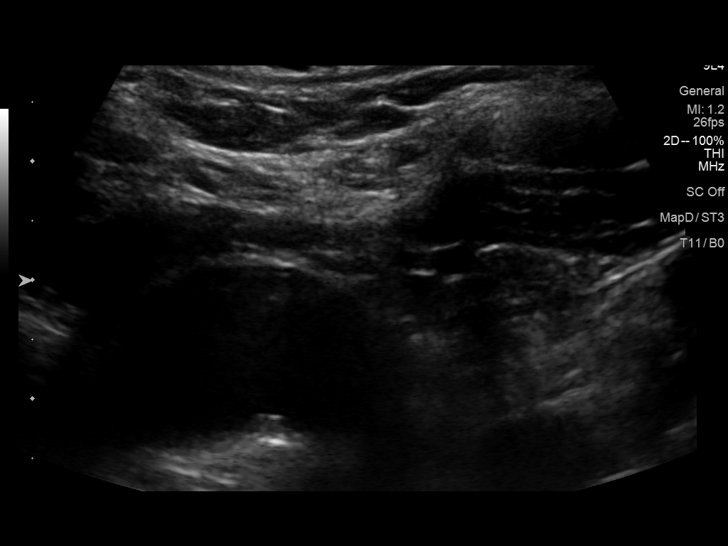
[im 40/42]
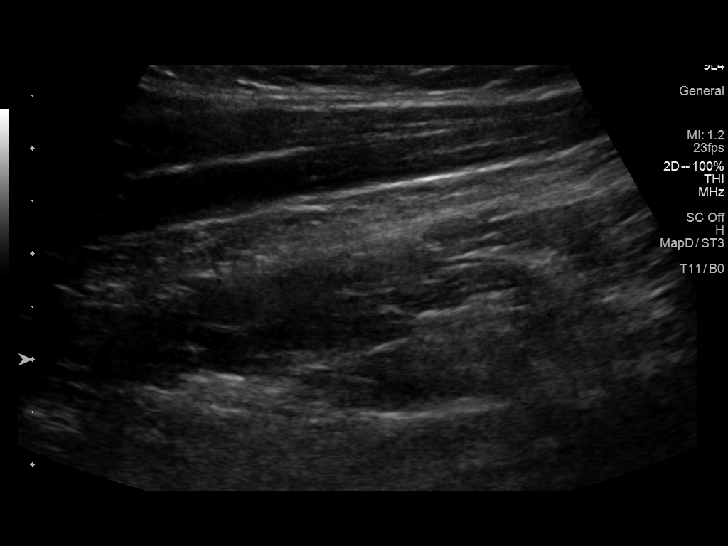

[12 of 25 positions shown; findings below may reference images not displayed]

FINDINGS: Parenchymal Echotexture: Mildly heterogenous

Isthmus: 0.6 cm, previously 0.6 cm

Right lobe: 5.2 x 1.7 x 2.1 cm, previously 5.6 x 1.9 x 2.2 cm

Left lobe: 5.6 x 1.8 x 2.5 cm, previously 5.5 x 2.5 x 2.6 cm

_________________________________________________________

Estimated total number of nodules >/= 1 cm: 3

Number of spongiform nodules >/=  2 cm not described below (TR1): 0

Number of mixed cystic and solid nodules >/= 1.5 cm not described
below (TR2): 0

_________________________________________________________

Previously visualized isthmus nodule is no longer conspicuous.

There is a subcentimeter spongiform nodule in the left superior
thyroid, not previously measured but partially visualized on several
images. This nodule does not require additional follow-up.

Nodule # 2 (previously labeled 3):

Prior biopsy: No

Location: Left; Mid

Maximum size: 1.0 cm; Other 2 dimensions: 0.8 x 0.8 cm, previously,
1.7 x 1.7 x 1.5 cm

Composition: spongiform (0)

Echogenicity: isoechoic (1)

Shape: not taller-than-wide (0)

Margins: ill-defined (0)

Echogenic foci: none (0)

ACR TI-RADS total points: 1.

ACR TI-RADS risk category:  TR1 (0-1 points).

Significant change in size (>/= 20% in two dimensions and minimal
increase of 2 mm): Yes

Change in features: Yes

Change in ACR TI-RADS risk category: Yes

ACR TI-RADS recommendations:

This nodule does NOT meet TI-RADS criteria for biopsy or dedicated
follow-up.

_________________________________________________________

Nodule # 3 (previously labeled 4):

Prior biopsy: No

Location: Left; Inferior

Maximum size: 1.6 cm; Other 2 dimensions: 1.2 x 1.0 cm, previously,
1.3 x 0.8 x 0.9 cm

Composition: solid/almost completely solid (2)

Echogenicity: isoechoic (1)

Shape: not taller-than-wide (0)

Margins: ill-defined (0)

Echogenic foci: none (0)

ACR TI-RADS total points: 3.

ACR TI-RADS risk category:  TR3 (3 points).

Significant change in size (>/= 20% in two dimensions and minimal
increase of 2 mm): Yes

Change in features: No

Change in ACR TI-RADS risk category: No

ACR TI-RADS recommendations:

*Given size (>/= 1.5 - 2.4 cm) and appearance, a follow-up
ultrasound in 1 year should be considered based on TI-RADS criteria.

_________________________________________________________

No cervical lymphadenopathy.
IMPRESSION: 1. Multinodular goiter with waxing and waning nodule size.
2. Interval enlargement of previously visualized solid nodule in the
left inferior thyroid (labeled 3, 1.6 cm, previously labeled 4,
cm) which meets criteria (TI-RADS category 3) for 1 year ultrasound
surveillance.
3. Decreased size of previously visualized spongiform nodule in the
left mid thyroid (labeled 2, 1.0 cm, previously labeled 3, 1.7 cm).
This nodule is declared benign and does not warrant additional
follow-up.
4. The previously visualized isthmus nodule is no longer
conspicuous.

The above is in keeping with the ACR TI-RADS recommendations - [HOSPITAL] 3018;[DATE].

## 2022-08-19 ENCOUNTER — Ambulatory Visit
Admission: RE | Admit: 2022-08-19 | Discharge: 2022-08-19 | Disposition: A | Discharge status: Home / Self Care | Payer: 59 | Source: Ambulatory Visit | Attending: Internal Medicine | Admitting: Internal Medicine

## 2022-08-19 DIAGNOSIS — E042 Nontoxic multinodular goiter: Secondary | ICD-10-CM

## 2022-12-07 ENCOUNTER — Encounter (HOSPITAL_COMMUNITY): Payer: Self-pay

## 2022-12-07 ENCOUNTER — Ambulatory Visit (HOSPITAL_COMMUNITY)
Admission: EM | Admit: 2022-12-07 | Discharge: 2022-12-07 | Disposition: A | Discharge status: Home / Self Care | Payer: 59 | Attending: Physician Assistant | Admitting: Physician Assistant

## 2022-12-07 DIAGNOSIS — L237 Allergic contact dermatitis due to plants, except food: Secondary | ICD-10-CM | POA: Diagnosis not present

## 2022-12-07 MED ORDER — PREDNISONE 10 MG PO TABS: 20.0000 mg | ORAL_TABLET | Freq: Every day | ORAL | 0 refills | Status: AC

## 2022-12-07 MED ORDER — METHYLPREDNISOLONE SODIUM SUCC 125 MG IJ SOLR
80.0000 mg | Freq: Once | INTRAMUSCULAR | Status: AC
  Administered 2022-12-07: 80 mg via INTRAMUSCULAR

## 2022-12-07 MED ORDER — HYDROXYZINE HCL 10 MG PO TABS: 10.0000 mg | ORAL_TABLET | Freq: Three times a day (TID) | ORAL | 0 refills | Status: AC | PRN

## 2022-12-07 MED ORDER — METHYLPREDNISOLONE SODIUM SUCC 125 MG IJ SOLR
INTRAMUSCULAR | Status: AC
  Filled 2022-12-07: qty 2

## 2022-12-07 NOTE — ED Triage Notes (Signed)
Rash started Tuesday. Worked in yard Sunday.

## 2022-12-07 NOTE — Discharge Instructions (Signed)
You were given a steroid shot at the urgent care today.  Be advised that this may increase your glucose levels.  I would like for you to start on prednisone by mouth as directed tomorrow morning as well.  Monitor your sugar levels at home and if anything starts to go in the 300 or 400s, contact your primary care physician.  You may take the hydroxyzine up to 3 times daily as needed to help with itching.  Continue on Allegra or Zyrtec daily.  Stay very well-hydrated and cool.  Be advised this may take 1-2 weeks to fully resolve.

## 2022-12-07 NOTE — ED Provider Notes (Signed)
[facility]   MRN: [id number] DOB: 12/04/1960  Subjective:   Diana Burns is a 62 y.o. female presenting for itchy rash. Worked in yard 6 days ago, rash started 4 days ago on her arms. Now on chest and face and neck. Blisters present. Trying to keep bandages over largest blisters. Known allergy to poison ivy, thinks she got into some vines at home. No chest pain or SOB. No fever or chills.   No current facility-administered medications for this encounter.  Current Outpatient Medications:    dapagliflozin propanediol (FARXIGA) 10 MG TABS tablet, Take 1 tablet (10 mg total) by mouth daily before breakfast., Disp: 90 tablet, Rfl: 3   hydrOXYzine (ATARAX) 10 MG tablet, Take 1 tablet (10 mg total) by mouth 3 (three) times daily as needed for itching. Caution drowsiness., Disp: 30 tablet, Rfl: 0   metFORMIN (GLUCOPHAGE-XR) 500 MG 24 hr tablet, Take 2 tablets (1,000 mg total) by mouth 2 (two) times daily., Disp: 360 tablet, Rfl: 3   predniSONE (DELTASONE) 10 MG tablet, Take 2 tablets (20 mg total) by mouth daily with breakfast for 4 days., Disp: 8 tablet, Rfl: 0   atorvastatin (LIPITOR) 20 MG tablet, Take 1 tablet (20 mg total) by mouth daily., Disp: 90 tablet, Rfl: 3   blood glucose meter kit and supplies KIT, Dispense based on patient and insurance preference. Use up to four times daily as directed. (FOR ICD-9 250.00, 250.01)., Disp: 1 each, Rfl: 0   Multiple Vitamin (MULTIVITAMIN WITH MINERALS) TABS tablet, Take 1 tablet daily by mouth., Disp: , Rfl:    Allergies  Allergen Reactions   Aspartame    Aspartame And Phenylalanine Other (See Comments)    Artificial sweetners - headache   Bee Venom Swelling   Grass Extracts [Gramineae Pollens]    Latex Hives and Itching   Phenylalanine Other (See Comments)    Artificial sweetners - headache   Poison Ivy Extract    Pollen Extract     Past Medical History:  Diagnosis Date   Allergy    Anemia    Arthritis    Diabetes  mellitus without complication (HCC)      Past Surgical History:  Procedure Laterality Date   KNEE SURGERY     TOTAL KNEE ARTHROPLASTY Left 05/23/2017   Procedure: LEFT TOTAL KNEE ARTHROPLASTY;  Surgeon: Eugenia Mcalpine, MD;  Location: [facility];  Service: Orthopedics;  Laterality: Left;    Family History  Problem Relation Age of Onset   Cancer Mother    Heart disease Mother    Hyperlipidemia Mother    Hypertension Mother     Social History   Tobacco Use   Smoking status: Never   Smokeless tobacco: Never  Vaping Use   Vaping Use: Never used  Substance Use Topics   Alcohol use: Yes    Comment: 1-2 times a year   Drug use: No    ROS REFER TO HPI FOR PERTINENT POSITIVES AND NEGATIVES   Objective:   Vitals: BP (!) 157/81 (BP Location: Left Arm)   Pulse 67   Temp 98.3 F (36.8 C) (Oral)   Resp 17   SpO2 94%   Physical Exam Vitals and nursing note reviewed.  Constitutional:      Appearance: Normal appearance. She is not ill-appearing.  HENT:     Mouth/Throat:     Mouth: Mucous membranes are moist.  Eyes:     Extraocular Movements: Extraocular movements intact.     Conjunctiva/sclera: Conjunctivae normal.  Pupils: Pupils are equal, round, and reactive to light.  Cardiovascular:     Rate and Rhythm: Normal rate and regular rhythm.     Pulses: Normal pulses.     Heart sounds: No murmur heard. Pulmonary:     Effort: Pulmonary effort is normal.     Breath sounds: Normal breath sounds.  Skin:    Findings: Lesion and rash present.     Comments: Bilateral forearms covered with intact clear vesicles, patches of excoriation and erythema. Similar on chest and left lateral neck. Left upper eyelid noted edema.   Neurological:     General: No focal deficit present.     Mental Status: She is alert and oriented to person, place, and time.  Psychiatric:        Mood and Affect: Mood normal.     No results found for this or any previous visit (from the past 24  hour(s)).  Assessment and Plan :   PDMP not reviewed this encounter.  1. Allergic dermatitis due to poison oak     Allergic contact dermatitis due to plants in T2DM patient. No red flags on exam, just overall appears very uncomfortable due to rash and itching. Solumedrol 80 mg IM provided here today. Follow with prednisone 20 mg x 4 days at home. Monitor glucose levels. Keep blisters intact. Keep cool and hydrated. Hydroxyzine also provided prn itching. F/up if worse / not improving.    AllwardtCrist Infante, PA-C 12/07/22 1322

## 2022-12-16 ENCOUNTER — Ambulatory Visit: Payer: 59 | Admitting: Internal Medicine

## 2022-12-16 ENCOUNTER — Encounter: Payer: Self-pay | Admitting: Internal Medicine

## 2022-12-16 VITALS — BP 130/70 | HR 94 | Ht 68.0 in | Wt 249.0 lb

## 2022-12-16 DIAGNOSIS — E669 Obesity, unspecified: Secondary | ICD-10-CM

## 2022-12-16 DIAGNOSIS — E042 Nontoxic multinodular goiter: Secondary | ICD-10-CM | POA: Diagnosis not present

## 2022-12-16 DIAGNOSIS — Z7984 Long term (current) use of oral hypoglycemic drugs: Secondary | ICD-10-CM | POA: Diagnosis not present

## 2022-12-16 DIAGNOSIS — E1165 Type 2 diabetes mellitus with hyperglycemia: Secondary | ICD-10-CM | POA: Diagnosis not present

## 2022-12-16 DIAGNOSIS — E119 Type 2 diabetes mellitus without complications: Secondary | ICD-10-CM

## 2022-12-16 DIAGNOSIS — Z6837 Body mass index (BMI) 37.0-37.9, adult: Secondary | ICD-10-CM

## 2022-12-16 DIAGNOSIS — E785 Hyperlipidemia, unspecified: Secondary | ICD-10-CM

## 2022-12-16 DIAGNOSIS — E66812 Obesity, class 2: Secondary | ICD-10-CM

## 2022-12-16 LAB — COMPREHENSIVE METABOLIC PANEL
ALT: 22 U/L (ref 0–35)
AST: 17 U/L (ref 0–37)
Albumin: 4.5 g/dL (ref 3.5–5.2)
Alkaline Phosphatase: 71 U/L (ref 39–117)
BUN: 25 mg/dL — ABNORMAL HIGH (ref 6–23)
CO2: 26 mEq/L (ref 19–32)
Calcium: 10.2 mg/dL (ref 8.4–10.5)
Chloride: 101 mEq/L (ref 96–112)
Creatinine, Ser: 1.4 mg/dL — ABNORMAL HIGH (ref 0.40–1.20)
GFR: 41.13 mL/min — ABNORMAL LOW (ref >60.00)
Glucose, Bld: 115 mg/dL — ABNORMAL HIGH (ref 70–99)
Potassium: 4.5 mEq/L (ref 3.5–5.1)
Sodium: 139 mEq/L (ref 135–145)
Total Bilirubin: 0.6 mg/dL (ref 0.2–1.2)
Total Protein: 7.5 g/dL (ref 6.0–8.3)

## 2022-12-16 LAB — LIPID PANEL
Cholesterol: 158 mg/dL (ref 0–200)
HDL: 64.1 mg/dL (ref >39.00)
LDL Cholesterol: 75 mg/dL (ref 0–99)
NonHDL: 94.18
Total CHOL/HDL Ratio: 2
Triglycerides: 94 mg/dL (ref 0.0–149.0)
VLDL: 18.8 mg/dL (ref 0.0–40.0)

## 2022-12-16 LAB — POCT GLYCOSYLATED HEMOGLOBIN (HGB A1C): Hemoglobin A1C: 6.9 % — AB (ref 4.0–5.6)

## 2022-12-16 MED ORDER — METFORMIN HCL ER 500 MG PO TB24: 1000.0000 mg | ORAL_TABLET | Freq: Two times a day (BID) | ORAL | 3 refills | Status: DC

## 2022-12-16 NOTE — Patient Instructions (Signed)
Please continue - Metformin ER 1000 mg 2x a day. - Farxiga 10 mg before b'fast.   Please return in 4 months with your sugar log.

## 2022-12-16 NOTE — Progress Notes (Unsigned)
Patient ID: Diana Burns, female   DOB: 02-23-1961, 62 y.o.   MRN: [id number]   HPI: Diana Burns is a 62 y.o.-year-old female, initially referred by her PCP, Dr. Cleta Alberts , returning for follow-up for DM2, dx in 10/2015, non-insulin-dependent, controlled, without long term complications.  Last visit 5 months ago.  Interim history:  + increased urination (also increased water intake), blurry vision, nausea, chest pain. She got a poison ivy rash this past week >> used calamine, then Benadryl cream >> allergic reaction: secondary burns on skin on arms, but also neck, face swelling >> was on po/I'm steroids.  Now getting better.  Reviewed hx: Pt was dx'ed with DM after an injury to her finger >> ED >> labs showed a Glu in the 300. Before diagnosis, She had a period of few months of increased stress  - very busy, many deaths among family and close friends. She was eating once a day, sleeping 2-3 hours a day - working days and nights. She also had steroid inj in knee (she will need to have total knee replacement), Prednisone for a tooth abscess, and Prednisone for laryngitis.  Reviewed HbA1c levels: Lab Results  Component Value Date   HGBA1C 7.6 (A) 07/22/2022   HGBA1C 6.3 (A) 01/21/2022   HGBA1C 6.1 (A) 03/27/2021   Pt is on a regimen of: - Metformin ER 1000 mg in am >> 1000 mg 2x a day - Farxiga 10 mg before breakfast-started 06/2022 She did not start Farxiga 5 mg before breakfast as suggested 05/2019. She was previously on Januvia.  Pt was checking sugars 1x a day - did not check when on steroids: - am: 90s-130 >> 70 >> n/c >> 84, 110-118 >> 116-120 >> 112-126 - 2h after b'fast: n/c >> 128-143 >> n/c   - before lunch: n/c  - 2h after lunch: n/c >> 139 >> n/c >> <180, occas. 185 >> 152-176 - before dinner: 100-106 >> n/c >> 83-140 >> n/c - 2h after dinner:  150-180 >> 185-210 >> n/c - bedtime: n/c >> 120s-148 >> n/c >> <120 >> <190 >> 98-116 - nighttime: n/c Lowest sugar was 78 >> 77 >>  100 >> 98.  It is unclear at which level she has hypoglycemia awareness. Highest sugar was 187 >> 183 (after icecream) >> 190 >> 176.  Glucometer: One Touch  -No CKD, last BUN/creatinine:  Lab Results  Component Value Date   BUN 20 01/21/2022   BUN 22 09/07/2019   CREATININE 0.77 01/21/2022   CREATININE 0.55 09/07/2019   -+ HL; last set of lipids: Lab Results  Component Value Date   CHOL 143 01/21/2022   HDL 57.50 01/21/2022   LDLCALC 75 01/21/2022   TRIG 54.0 01/21/2022   CHOLHDL 2 01/21/2022  I previously advised her to restart Lipitor 20 mg daily but she remains off Lipitor for now.  - last eye exam was 01/14/2022: No DR reportedly. Astigmatism resolved.  She sees Dr. Katrinka Blazing.  - no numbness and tingling in her feet.  Last foot exam 01/21/2022.  She has a history of thyromegaly and 3 small thyroid nodules per thyroid ultrasound from 2008: Comparison: [facility] CT Chest report, 09/18/01.  Findings: The thyroid gland is upper limits of normal in size to slightly enlarged with the right lobe measuring 5.2 cm long x 1.7 cm AP x 2.6 cm wide and left lobe 5.1 cm long x 1.5 cm AP x 2.2 cm wide with slight thickness at the midline isthmus measuring 5 mm. Thyroid  echo texture is diffusely inhomogeneous.  Three nodules are seen within the thyroid gland: The largest at the posterior mid right is solid measuring 1 cm long x 0.7 cm AP x 0.7 cm wide.  Similar posterior lower pole left lobe solid nodule measures 0.7 cm long x 0.5 cm AP x 0.6 cm wide.  A tiny 2 mm cyst is seen at the medial superior right lobe.   IMPRESSION:  Heterogeneous borderline diffusely enlarged thyroid gland with three discrete probable adenomas. Differential diagnosis includes subtle multinodular goiter and chronic Hashimoto's thyroiditis.      Thyroid U/S (09/10/2019): Parenchymal Echotexture: Mildly heterogenous Isthmus: 0.6 cm thickness, previously 0.5 Right lobe: 5.6 x 1.9 x 2.2 cm, previously 5.2 x 1.7 x 2.6 Left  lobe: 5.5 x 2.5 x 2.6 cm, previously 5.1 x 1.5 x 2.2 _________________________________________________________   Nodule # 1: Location: Isthmus; Maximum size: 1.1 cm; Other 2 dimensions: 0.6 x 0.6 cm Composition: solid/almost completely solid (2) Echogenicity: hypoechoic (2) *Given size (>/= 1 - 1.4 cm) and appearance, a follow-up ultrasound in 1 year should be considered based on TI-RADS criteria.  Nodule # 2: 0.9 cm hypoechoic mid right, previously 1 cm; stability for greater than 5 years implies benignity; This nodule does NOT meet TI-RADS criteria for biopsy or dedicated follow-up.   Nodule # 3: Location: Left; Mid Maximum size: 1.7 cm; Other 2 dimensions: 1.5 x 1.7 cm Composition: mixed cystic and solid (1) Echogenicity: hypoechoic (2) *Given size (>/= 1.5 - 2.4 cm) and appearance, a follow-up ultrasound in 1 year should be considered based on TI-RADS criteria.  ________________________________________________________   Nodule # 4: Location: Left; Inferior Maximum size: 1.3 cm; Other 2 dimensions: 0.8 x 0.9 cm Composition: solid/almost completely solid (2) Echogenicity: hypoechoic (2) *Given size (>/= 1 - 1.4 cm) and appearance, a follow-up ultrasound in 1 year should be considered based on TI-RADS criteria.   IMPRESSION: 1. Thyromegaly with bilateral nodules. None meets criteria for biopsy. 2. Recommend annual/biennial ultrasound follow-up of nodules as above, until stability x5 years confirmed.   Thyroid U/S (04/30/2021): Parenchymal Echotexture: Mildly heterogenous Isthmus: 0.6 cm, previously 0.6 cm Right lobe: 5.2 x 1.7 x 2.1 cm, previously 5.6 x 1.9 x 2.2 cm Left lobe: 5.6 x 1.8 x 2.5 cm, previously 5.5 x 2.5 x 2.6 cm _____________________________________________________  Previously visualized isthmus nodule is no longer conspicuous.  There is a subcentimeter spongiform nodule in the left superior thyroid, not previously measured but partially visualized on  several images. This nodule does not require additional follow-up.  Nodule # 2 (previously labeled 3): Prior biopsy: No Location: Left; Mid Maximum size: 1.0 cm; Other 2 dimensions: 0.8 x 0.8 cm, previously, 1.7 x 1.7 x 1.5 cm Composition: spongiform (0) Echogenicity: isoechoic (1)  Significant change in size (>/= 20% in two dimensions and minimal increase of 2 mm): Yes Change in features: Yes Change in ACR TI-RADS risk category: Yes This nodule does NOT meet TI-RADS criteria for biopsy or dedicated testing. follow-up. ________________________________________________________  Nodule # 3 (previously labeled 4): Prior biopsy: No Location: Left; Inferior Maximum size: 1.6 cm; Other 2 dimensions: 1.2 x 1.0 cm, previously, 1.3 x 0.8 x 0.9 cm Composition: solid/almost completely solid (2) Echogenicity: isoechoic (1) Significant change in size (>/= 20% in two dimensions and minimal increase of 2 mm): Yes Change in features: No *Given size (>/= 1.5 - 2.4 cm) and appearance, a follow-up ultrasound in 1 year should be considered based on TI-RADS criteria. _________________________________________________________  No cervical lymphadenopathy.  IMPRESSION: 1. Multinodular goiter with waxing and waning nodule size. 2. Interval enlargement of previously visualized solid nodule in the left inferior thyroid (labeled 3, 1.6 cm, previously labeled 4, 1.3 cm) which meets criteria (TI-RADS category 3) for 1 year ultrasound surveillance. 3. Decreased size of previously visualized spongiform nodule in the left mid thyroid (labeled 2, 1.0 cm, previously labeled 3, 1.7 cm). This nodule is declared benign and does not warrant additional follow-up. 4. The previously visualized isthmus nodule is no longer conspicuous.  Pt denies: - feeling nodules in neck - hoarseness - dysphagia - choking  TSH reviewed: Lab Results  Component Value Date   TSH 1.41 09/07/2019   TSH 0.49 11/27/2015   TSH 1.305  05/03/2014   She had TKR in 04/2018 and a revision in 08/2017.   ROS: + See HPI  I reviewed pt's medications, allergies, PMH, social hx, family hx, and changes were documented in the history of present illness. Otherwise, unchanged from my initial visit note.  Past Medical History:  Diagnosis Date   Allergy    Anemia    Arthritis    Diabetes mellitus without complication (HCC)    Past Surgical History:  Procedure Laterality Date   KNEE SURGERY     TOTAL KNEE ARTHROPLASTY Left 05/23/2017   Procedure: LEFT TOTAL KNEE ARTHROPLASTY;  Surgeon: Eugenia Mcalpine, MD;  Location: [facility];  Service: Orthopedics;  Laterality: Left;   Social History   Social History   Marital status: Single    Spouse name: N/A   Number of children: 0   Occupational History   Geophysical data processor    Social History Main Topics   Smoking status: Never Smoker   Smokeless tobacco: Not on file   Alcohol use Yes - wine and mixed drinks rarely    Drug use: No   Social History Narrative   Raised between [place] and [place].   Current Outpatient Medications on File Prior to Visit  Medication Sig Dispense Refill   atorvastatin (LIPITOR) 20 MG tablet Take 1 tablet (20 mg total) by mouth daily. 90 tablet 3   blood glucose meter kit and supplies KIT Dispense based on patient and insurance preference. Use up to four times daily as directed. (FOR ICD-9 250.00, 250.01). 1 each 0   dapagliflozin propanediol (FARXIGA) 10 MG TABS tablet Take 1 tablet (10 mg total) by mouth daily before breakfast. 90 tablet 3   hydrOXYzine (ATARAX) 10 MG tablet Take 1 tablet (10 mg total) by mouth 3 (three) times daily as needed for itching. Caution drowsiness. 30 tablet 0   metFORMIN (GLUCOPHAGE-XR) 500 MG 24 hr tablet Take 2 tablets (1,000 mg total) by mouth 2 (two) times daily. 360 tablet 3   Multiple Vitamin (MULTIVITAMIN WITH MINERALS) TABS tablet Take 1 tablet daily by mouth.     No current facility-administered  medications on file prior to visit.   Allergies  Allergen Reactions   Aspartame    Aspartame And Phenylalanine Other (See Comments)    Artificial sweetners - headache   Bee Venom Swelling   Grass Extracts [Gramineae Pollens]    Latex Hives and Itching   Phenylalanine Other (See Comments)    Artificial sweetners - headache   Poison Ivy Extract    Pollen Extract    Family History  Problem Relation Age of Onset   Cancer Mother    Heart disease Mother    Hyperlipidemia Mother    Hypertension Mother    PE: BP 130/70   Pulse  94   Ht 5\' 8"  (1.727 m)   Wt 249 lb (112.9 kg)   SpO2 99%   BMI 37.86 kg/m  Wt Readings from Last 3 Encounters:  12/16/22 249 lb (112.9 kg)  07/22/22 267 lb (121.1 kg)  01/21/22 252 lb 6.4 oz (114.5 kg)   Constitutional: overweight, in NAD Eyes: no exophthalmos ENT: moist mucous membranes, no masses palpated in neck, no cervical lymphadenopathy Cardiovascular: RRR, No MRG, + pitting edema bilateral LE and feet (positional edema) Respiratory: CTA B Musculoskeletal: no deformities Skin: no rashes Neurological: no tremor with outstretched hands Diabetic Foot Exam - Simple   Simple Foot Form Diabetic Foot exam was performed with the following findings: Yes 12/16/2022  1:19 PM  Visual Inspection No deformities, no ulcerations, no other skin breakdown bilaterally: Yes Sensation Testing Intact to touch and monofilament testing bilaterally: Yes Pulse Check Posterior Tibialis and Dorsalis pulse intact bilaterally: Yes Comments Discolored toenails B B pitting edema in feet    ASSESSMENT: 1. DM2, non-insulin-dependent, uncontrolled, without long term complications but with hyperglycemia  2. Obesity class II  3. HL  4.  Multiple thyroid nodules  PLAN:  1. Patient with history of controlled diabetes, with improved blood sugar control if she started to improve her diet, starting exercise, and taking her medications as prescribed.  However, during the  coronavirus pandemic, she relaxed her diet and stopped exercise.  She restarted to improve her diet again and trying to walk for exercise and HbA1c improved to 6.3%.  At last visit, she again relaxed her diet and exercise and HbA1c returned higher, at 7.6%.  I recommended to add an SGLT2 inhibitor to her metformin regimen. -At today's visit, sugars are at goal, but she did not check during her treatment with steroids.  However, she is off steroids for now and her poison ivy rash/allergic reaction improves.  Will continue the current regimen.  I gave her a written prescription for metformin and she was not able to obtain this enough quantity from the pharmacy. - I suggested to:   Patient Instructions  Please continue - Metformin ER 1000 mg 2x a day. - Farxiga 10 mg before b'fast.  Please return in 4 months with your sugar log.   - we checked her HbA1c: 6.9% (better) - advised to check sugars at different times of the day - 1x a day, rotating check times - advised for yearly eye exams >> she is UTD - return to clinic in 4 months  2.  Obesity class II -Will continue metformin ER which has an appetite suppressant effect long-term -At last visit she gained another 16 pounds (previously gained 17) but possibly also due to fluid retention as she had significant leg swelling -At last visit we added  farxiga, which can also help with weight loss.  She lost 18 pounds since then!  3. HL -Reviewed lipid panel from 12/2021: Fractions at goal: Lab Results  Component Value Date   CHOL 143 01/21/2022   HDL 57.50 01/21/2022   LDLCALC 75 01/21/2022   TRIG 54.0 01/21/2022   CHOLHDL 2 01/21/2022  -She was previously on Lipitor 20 mg daily but ran out.  I did recommend to restart this but she did not do so yet.  At last visit, fractions were at goal.  -Will check the lipid panel today  4.  Multiple thyroid nodules -No neck compression symptoms -Reviewed her thyroid ultrasound report from 04/2021 which  showed that the isthmic nodule was no longer conspicuous.  She had a subcentimeter left superior thyroid nodule which is not worrisome and no further follow-up is needed.  Her left mid thyroid nodule decreased in size and no follow-up was needed for this.  Her left inferior thyroid nodule appeared to be slightly larger and the recommendation was for repeat ultrasound in a year -We checked another thyroid ultrasound after last visit (08/19/2022): The left inferior thyroid nodule was stable, at 1.8 cm and met criteria for 1 year follow-up.  Will check another ultrasound next year. -Latest TSH is normal: Lab Results  Component Value Date   TSH 1.41 09/07/2019   Carlus Pavlov, MD PhD [facility]

## 2022-12-17 LAB — MICROALBUMIN / CREATININE URINE RATIO
Creatinine,U: 100.9 mg/dL
Microalb Creat Ratio: 0.7 mg/g (ref 0.0–30.0)
Microalb, Ur: <0.7 mg/dL (ref 0.0–1.9)

## 2023-02-20 NOTE — Addendum Note (Signed)
Addended by: Pollie Meyer on: 02/20/2023 11:35 AM   Modules accepted: Orders

## 2023-04-07 ENCOUNTER — Ambulatory Visit: Payer: 59 | Admitting: Internal Medicine

## 2023-04-18 ENCOUNTER — Ambulatory Visit: Payer: 59 | Admitting: Internal Medicine

## 2023-04-23 ENCOUNTER — Ambulatory Visit: Payer: 59 | Admitting: Internal Medicine

## 2023-05-27 ENCOUNTER — Ambulatory Visit: Payer: 59 | Admitting: Internal Medicine

## 2023-07-15 ENCOUNTER — Other Ambulatory Visit: Payer: Self-pay | Admitting: Internal Medicine

## 2023-07-16 ENCOUNTER — Ambulatory Visit: Payer: 59 | Admitting: Internal Medicine

## 2023-08-22 ENCOUNTER — Other Ambulatory Visit: Payer: Self-pay

## 2023-08-22 DIAGNOSIS — E1165 Type 2 diabetes mellitus with hyperglycemia: Secondary | ICD-10-CM

## 2023-08-22 MED ORDER — METFORMIN HCL ER 500 MG PO TB24
1000.0000 mg | ORAL_TABLET | Freq: Two times a day (BID) | ORAL | 3 refills | Status: AC
Start: 2023-08-22 — End: ?

## 2023-08-22 MED ORDER — METFORMIN HCL ER 500 MG PO TB24
1000.0000 mg | ORAL_TABLET | Freq: Two times a day (BID) | ORAL | 3 refills | Status: DC
Start: 2023-08-22 — End: 2023-08-22

## 2023-09-18 ENCOUNTER — Ambulatory Visit: Payer: 59 | Admitting: Internal Medicine

## 2023-12-23 ENCOUNTER — Ambulatory Visit: Admitting: Internal Medicine

## 2024-02-26 ENCOUNTER — Ambulatory Visit: Admitting: Internal Medicine

## 2024-05-13 ENCOUNTER — Ambulatory Visit: Admitting: Internal Medicine

## 2024-05-18 LAB — OPHTHALMOLOGY REPORT-SCANNED

## 2024-07-15 ENCOUNTER — Ambulatory Visit: Admitting: Internal Medicine

## 2024-08-18 ENCOUNTER — Ambulatory Visit: Admitting: Internal Medicine
